# Patient Record
Sex: Female | Born: 1975 | Race: White | Hispanic: No | Smoking: Former smoker
Health system: Southern US, Community
[De-identification: ages and names within clinical notes are randomized; demographics above are authoritative.]

## PROBLEM LIST (undated history)

## (undated) DIAGNOSIS — F419 Anxiety disorder, unspecified: Secondary | ICD-10-CM

## (undated) DIAGNOSIS — F329 Major depressive disorder, single episode, unspecified: Secondary | ICD-10-CM

## (undated) DIAGNOSIS — I10 Essential (primary) hypertension: Secondary | ICD-10-CM

## (undated) DIAGNOSIS — F32A Depression, unspecified: Secondary | ICD-10-CM

## (undated) DIAGNOSIS — G35 Multiple sclerosis: Secondary | ICD-10-CM

## (undated) HISTORY — DX: Depression, unspecified: F32.A

## (undated) HISTORY — DX: Major depressive disorder, single episode, unspecified: F32.9

## (undated) HISTORY — PX: WISDOM TOOTH EXTRACTION: SHX21

## (undated) HISTORY — DX: Anxiety disorder, unspecified: F41.9

## (undated) HISTORY — DX: Multiple sclerosis: G35

## (undated) HISTORY — PX: MOUTH SURGERY: SHX715

---

## 2002-07-07 ENCOUNTER — Other Ambulatory Visit: Admission: RE | Admit: 2002-07-07 | Discharge: 2002-07-07 | Payer: Self-pay | Admitting: Obstetrics and Gynecology

## 2002-08-31 ENCOUNTER — Encounter: Admission: RE | Admit: 2002-08-31 | Discharge: 2002-08-31 | Payer: Self-pay | Admitting: Family Medicine

## 2002-08-31 ENCOUNTER — Encounter: Payer: Self-pay | Admitting: Family Medicine

## 2003-05-04 ENCOUNTER — Other Ambulatory Visit: Admission: RE | Admit: 2003-05-04 | Discharge: 2003-05-04 | Payer: Self-pay | Admitting: Obstetrics and Gynecology

## 2003-12-06 ENCOUNTER — Inpatient Hospital Stay (HOSPITAL_COMMUNITY): Admission: AD | Admit: 2003-12-06 | Discharge: 2003-12-08 | Payer: Self-pay | Admitting: Obstetrics and Gynecology

## 2005-09-08 ENCOUNTER — Inpatient Hospital Stay (HOSPITAL_COMMUNITY): Admission: AD | Admit: 2005-09-08 | Discharge: 2005-09-08 | Payer: Self-pay | Admitting: *Deleted

## 2005-12-17 ENCOUNTER — Inpatient Hospital Stay (HOSPITAL_COMMUNITY): Admission: AD | Admit: 2005-12-17 | Discharge: 2005-12-20 | Payer: Self-pay | Admitting: Obstetrics and Gynecology

## 2006-06-10 ENCOUNTER — Encounter: Admission: RE | Admit: 2006-06-10 | Discharge: 2006-06-10 | Payer: Self-pay | Admitting: Family Medicine

## 2006-07-10 ENCOUNTER — Encounter: Admission: RE | Admit: 2006-07-10 | Discharge: 2006-07-30 | Payer: Self-pay | Admitting: Neurology

## 2006-08-01 ENCOUNTER — Encounter: Admission: RE | Admit: 2006-08-01 | Discharge: 2006-08-01 | Payer: Self-pay | Admitting: Optometry

## 2006-09-07 ENCOUNTER — Encounter: Admission: RE | Admit: 2006-09-07 | Discharge: 2006-09-07 | Payer: Self-pay | Admitting: Neurology

## 2006-11-18 ENCOUNTER — Encounter: Admission: RE | Admit: 2006-11-18 | Discharge: 2006-11-18 | Payer: Self-pay | Admitting: Neurology

## 2007-01-28 ENCOUNTER — Encounter: Admission: RE | Admit: 2007-01-28 | Discharge: 2007-01-28 | Payer: Self-pay | Admitting: Obstetrics and Gynecology

## 2007-05-02 ENCOUNTER — Encounter: Admission: RE | Admit: 2007-05-02 | Discharge: 2007-05-02 | Payer: Self-pay | Admitting: *Deleted

## 2009-10-09 ENCOUNTER — Emergency Department (HOSPITAL_COMMUNITY): Admission: EM | Admit: 2009-10-09 | Discharge: 2009-10-09 | Payer: Self-pay | Admitting: Emergency Medicine

## 2010-03-04 ENCOUNTER — Encounter: Payer: Self-pay | Admitting: Neurology

## 2010-06-29 NOTE — H&P (Signed)
[name], [name]             ACCOUNT NO.:  [id number]   MEDICAL RECORD NO.:  [id number]          PATIENT TYPE:  INP   LOCATION:  9164                          FACILITY:  [facility]   PHYSICIAN:  Lenoard Aden, M.D.DATE OF BIRTH:  03/24/1975   DATE OF ADMISSION:  12/17/2005  DATE OF DISCHARGE:                                HISTORY & PHYSICAL   CHIEF COMPLAINT:  Post dates induction.   She is a 35 year old white female G2, P12, EDD December 13, 2005 at [redacted] weeks  gestation for Cervidil induction.  She has history of spontaneous delivery  x1 of 7 pound 2 ounce child in 2005.  No known drug allergies.   MEDICATIONS:  Prenatal vitamins.   MEDICAL HISTORY:  Remarkable for migraine headaches.   SOCIAL HISTORY:  Noncontributory.   FAMILY HISTORY:  She has a family history of heart disease, diabetes,  rheumatoid arthritis, lung and breast cancer.  She is a nonsmoker,  nondrinker, __________.   PHYSICAL EXAMINATION:  She is a well-developed, well-nourished white female  in no acute distress.  HEENT:  Normal.  LUNGS:  Clear.  HEART:  Regular rate and rhythm.  ABDOMEN:  Soft, gravid and nontender.  Estimated fetal weight is 7-1/2 to 8  pounds.  CERVIX:  2 cm, thick vertex, -2.  EXTREMITIES:  No cords.  NEUROLOGICAL:  Nonfocal.   IMPRESSION:  Post dates intrauterine pregnancy for Cervidil induction and  Cervidil placement.  Nonstress test reactive.  Anticipate attempts at  vaginal delivery.      Lenoard Aden, M.D.  Electronically Signed     RJT/MEDQ  D:  12/17/2005  T:  12/17/2005  Job:  [id number]

## 2011-02-20 DIAGNOSIS — Z124 Encounter for screening for malignant neoplasm of cervix: Secondary | ICD-10-CM | POA: Diagnosis not present

## 2011-02-20 DIAGNOSIS — Z30432 Encounter for removal of intrauterine contraceptive device: Secondary | ICD-10-CM | POA: Diagnosis not present

## 2011-02-20 DIAGNOSIS — Z01419 Encounter for gynecological examination (general) (routine) without abnormal findings: Secondary | ICD-10-CM | POA: Diagnosis not present

## 2011-03-06 DIAGNOSIS — G47419 Narcolepsy without cataplexy: Secondary | ICD-10-CM | POA: Diagnosis not present

## 2011-03-06 DIAGNOSIS — G35 Multiple sclerosis: Secondary | ICD-10-CM | POA: Diagnosis not present

## 2011-03-06 DIAGNOSIS — R5383 Other fatigue: Secondary | ICD-10-CM | POA: Diagnosis not present

## 2011-03-06 DIAGNOSIS — R5381 Other malaise: Secondary | ICD-10-CM | POA: Diagnosis not present

## 2011-03-26 DIAGNOSIS — S139XXA Sprain of joints and ligaments of unspecified parts of neck, initial encounter: Secondary | ICD-10-CM | POA: Diagnosis not present

## 2011-03-26 DIAGNOSIS — S239XXA Sprain of unspecified parts of thorax, initial encounter: Secondary | ICD-10-CM | POA: Diagnosis not present

## 2011-03-26 DIAGNOSIS — M999 Biomechanical lesion, unspecified: Secondary | ICD-10-CM | POA: Diagnosis not present

## 2011-03-26 DIAGNOSIS — S335XXA Sprain of ligaments of lumbar spine, initial encounter: Secondary | ICD-10-CM | POA: Diagnosis not present

## 2011-03-26 DIAGNOSIS — M9981 Other biomechanical lesions of cervical region: Secondary | ICD-10-CM | POA: Diagnosis not present

## 2011-04-02 DIAGNOSIS — R5383 Other fatigue: Secondary | ICD-10-CM | POA: Diagnosis not present

## 2011-04-02 DIAGNOSIS — R5381 Other malaise: Secondary | ICD-10-CM | POA: Diagnosis not present

## 2011-04-02 DIAGNOSIS — G35 Multiple sclerosis: Secondary | ICD-10-CM | POA: Diagnosis not present

## 2011-04-02 DIAGNOSIS — G47419 Narcolepsy without cataplexy: Secondary | ICD-10-CM | POA: Diagnosis not present

## 2011-05-04 DIAGNOSIS — G35 Multiple sclerosis: Secondary | ICD-10-CM | POA: Diagnosis not present

## 2011-05-09 DIAGNOSIS — R269 Unspecified abnormalities of gait and mobility: Secondary | ICD-10-CM | POA: Diagnosis not present

## 2011-05-09 DIAGNOSIS — R5381 Other malaise: Secondary | ICD-10-CM | POA: Diagnosis not present

## 2011-05-09 DIAGNOSIS — G35 Multiple sclerosis: Secondary | ICD-10-CM | POA: Diagnosis not present

## 2011-05-09 DIAGNOSIS — R5383 Other fatigue: Secondary | ICD-10-CM | POA: Diagnosis not present

## 2011-06-06 DIAGNOSIS — G35 Multiple sclerosis: Secondary | ICD-10-CM | POA: Diagnosis not present

## 2011-06-06 DIAGNOSIS — Z79899 Other long term (current) drug therapy: Secondary | ICD-10-CM | POA: Diagnosis not present

## 2011-06-18 DIAGNOSIS — G35 Multiple sclerosis: Secondary | ICD-10-CM | POA: Diagnosis not present

## 2011-06-18 DIAGNOSIS — Z79899 Other long term (current) drug therapy: Secondary | ICD-10-CM | POA: Diagnosis not present

## 2011-07-11 DIAGNOSIS — H4610 Retrobulbar neuritis, unspecified eye: Secondary | ICD-10-CM | POA: Diagnosis not present

## 2011-07-11 DIAGNOSIS — H5210 Myopia, unspecified eye: Secondary | ICD-10-CM | POA: Diagnosis not present

## 2011-07-11 DIAGNOSIS — G35 Multiple sclerosis: Secondary | ICD-10-CM | POA: Diagnosis not present

## 2011-07-11 DIAGNOSIS — H16229 Keratoconjunctivitis sicca, not specified as Sjogren's, unspecified eye: Secondary | ICD-10-CM | POA: Diagnosis not present

## 2011-07-24 DIAGNOSIS — G35 Multiple sclerosis: Secondary | ICD-10-CM | POA: Diagnosis not present

## 2011-07-24 DIAGNOSIS — R5383 Other fatigue: Secondary | ICD-10-CM | POA: Diagnosis not present

## 2011-07-24 DIAGNOSIS — I498 Other specified cardiac arrhythmias: Secondary | ICD-10-CM | POA: Diagnosis not present

## 2011-07-24 DIAGNOSIS — R5381 Other malaise: Secondary | ICD-10-CM | POA: Diagnosis not present

## 2011-08-09 DIAGNOSIS — S336XXA Sprain of sacroiliac joint, initial encounter: Secondary | ICD-10-CM | POA: Diagnosis not present

## 2011-08-09 DIAGNOSIS — M9981 Other biomechanical lesions of cervical region: Secondary | ICD-10-CM | POA: Diagnosis not present

## 2011-08-09 DIAGNOSIS — M530 Cervicocranial syndrome: Secondary | ICD-10-CM | POA: Diagnosis not present

## 2011-08-09 DIAGNOSIS — M999 Biomechanical lesion, unspecified: Secondary | ICD-10-CM | POA: Diagnosis not present

## 2011-08-09 DIAGNOSIS — IMO0002 Reserved for concepts with insufficient information to code with codable children: Secondary | ICD-10-CM | POA: Diagnosis not present

## 2011-08-12 DIAGNOSIS — S336XXA Sprain of sacroiliac joint, initial encounter: Secondary | ICD-10-CM | POA: Diagnosis not present

## 2011-08-12 DIAGNOSIS — IMO0002 Reserved for concepts with insufficient information to code with codable children: Secondary | ICD-10-CM | POA: Diagnosis not present

## 2011-08-12 DIAGNOSIS — M530 Cervicocranial syndrome: Secondary | ICD-10-CM | POA: Diagnosis not present

## 2011-08-12 DIAGNOSIS — M9981 Other biomechanical lesions of cervical region: Secondary | ICD-10-CM | POA: Diagnosis not present

## 2011-08-12 DIAGNOSIS — M999 Biomechanical lesion, unspecified: Secondary | ICD-10-CM | POA: Diagnosis not present

## 2011-08-19 DIAGNOSIS — S336XXA Sprain of sacroiliac joint, initial encounter: Secondary | ICD-10-CM | POA: Diagnosis not present

## 2011-08-19 DIAGNOSIS — M999 Biomechanical lesion, unspecified: Secondary | ICD-10-CM | POA: Diagnosis not present

## 2011-08-19 DIAGNOSIS — M530 Cervicocranial syndrome: Secondary | ICD-10-CM | POA: Diagnosis not present

## 2011-08-19 DIAGNOSIS — M9981 Other biomechanical lesions of cervical region: Secondary | ICD-10-CM | POA: Diagnosis not present

## 2011-08-19 DIAGNOSIS — IMO0002 Reserved for concepts with insufficient information to code with codable children: Secondary | ICD-10-CM | POA: Diagnosis not present

## 2011-08-22 DIAGNOSIS — M9981 Other biomechanical lesions of cervical region: Secondary | ICD-10-CM | POA: Diagnosis not present

## 2011-08-22 DIAGNOSIS — M999 Biomechanical lesion, unspecified: Secondary | ICD-10-CM | POA: Diagnosis not present

## 2011-08-22 DIAGNOSIS — IMO0002 Reserved for concepts with insufficient information to code with codable children: Secondary | ICD-10-CM | POA: Diagnosis not present

## 2011-08-22 DIAGNOSIS — S336XXA Sprain of sacroiliac joint, initial encounter: Secondary | ICD-10-CM | POA: Diagnosis not present

## 2011-08-22 DIAGNOSIS — M530 Cervicocranial syndrome: Secondary | ICD-10-CM | POA: Diagnosis not present

## 2011-08-28 DIAGNOSIS — IMO0002 Reserved for concepts with insufficient information to code with codable children: Secondary | ICD-10-CM | POA: Diagnosis not present

## 2011-08-28 DIAGNOSIS — M530 Cervicocranial syndrome: Secondary | ICD-10-CM | POA: Diagnosis not present

## 2011-08-28 DIAGNOSIS — M999 Biomechanical lesion, unspecified: Secondary | ICD-10-CM | POA: Diagnosis not present

## 2011-08-28 DIAGNOSIS — M9981 Other biomechanical lesions of cervical region: Secondary | ICD-10-CM | POA: Diagnosis not present

## 2011-08-28 DIAGNOSIS — S336XXA Sprain of sacroiliac joint, initial encounter: Secondary | ICD-10-CM | POA: Diagnosis not present

## 2011-09-04 DIAGNOSIS — M530 Cervicocranial syndrome: Secondary | ICD-10-CM | POA: Diagnosis not present

## 2011-09-04 DIAGNOSIS — IMO0002 Reserved for concepts with insufficient information to code with codable children: Secondary | ICD-10-CM | POA: Diagnosis not present

## 2011-09-04 DIAGNOSIS — S336XXA Sprain of sacroiliac joint, initial encounter: Secondary | ICD-10-CM | POA: Diagnosis not present

## 2011-09-04 DIAGNOSIS — M9981 Other biomechanical lesions of cervical region: Secondary | ICD-10-CM | POA: Diagnosis not present

## 2011-09-04 DIAGNOSIS — M999 Biomechanical lesion, unspecified: Secondary | ICD-10-CM | POA: Diagnosis not present

## 2011-09-05 DIAGNOSIS — R0602 Shortness of breath: Secondary | ICD-10-CM | POA: Diagnosis not present

## 2011-09-25 ENCOUNTER — Ambulatory Visit (INDEPENDENT_AMBULATORY_CARE_PROVIDER_SITE_OTHER): Payer: No Typology Code available for payment source | Admitting: Psychiatry

## 2011-09-25 ENCOUNTER — Encounter (HOSPITAL_COMMUNITY): Payer: Self-pay | Admitting: Psychiatry

## 2011-09-25 DIAGNOSIS — F430 Acute stress reaction: Secondary | ICD-10-CM

## 2011-09-25 DIAGNOSIS — F439 Reaction to severe stress, unspecified: Secondary | ICD-10-CM | POA: Insufficient documentation

## 2011-09-25 DIAGNOSIS — F329 Major depressive disorder, single episode, unspecified: Secondary | ICD-10-CM

## 2011-09-25 NOTE — Progress Notes (Signed)
Patient ID: [name], female   DOB: 1975-09-07, 36 y.o.   MRN: [id number] Patient:   [name]   DOB:   01/15/1976  MR Number:  [id number]  Location:  [facility] [address] [place] [address] Dept: [phone]           Date of Service:   09/25/11  Start Time:   2:00 pm End Time:   3:00 pm  Provider/Observer:  Carman Ching LCSW       Billing Code/Service: [phone]  Chief Complaint:     Chief Complaint  Patient presents with  . Depression    Reason for Service:  Symptoms of depression, feeling disconnected, "not like self", low self-esteem and low energy.  Current Status:  Patient reports not feeling like herself and rated a 7/9 on the depression symptom list indicating moderate depression symptoms and stress at home.   Reliability of Information: Patient appears to be a reliable informant.  Behavioral Observation: [name]  presents as a 36 y.o.-year-old Right Caucasian Female who appeared her stated age. her dress was Appropriate and she was Neat and Well Groomed and her manners were Appropriate to the situation.  There were not any physical disabilities noted.  she displayed an appropriate level of cooperation and motivation.  She reports this being her first session with a counselor and states she was referred by a friend of hers who recommended Clinical research associate. Patient was tearful as she described feeling increased "guilt" over not being a better mom to her two children, constant feelings of "irritablity", increased weight gain, insomnia, racing thoughts, low self-esteem and low energy. She states she has turned to alcohol more than usual to cope and is smoking cigarettes to manage the stress and worries about how these unhealthy behaviors could affect her diagnosis of Multiple Sclerosis. She appears motivated to reduce symptoms of depression and learn positive coping strategies.  She states she used to do Yoga and Meditation and found them helpful, but somehow got away from doing healthy activities for herself.   Interactions:    Active   Attention:   within normal limits  Memory:   within normal limits  Visuo-spatial:   within normal limits  Speech (Volume):  normal  Speech:   normal pitch and normal volume  Thought Process:  Coherent  Though Content:  WNL  Orientation:   person, place, time/date, situation, day of week, month of year and year  Judgment:   Good  Planning:   Good  Affect:    Depressed  Mood:    Depressed  Insight:   Good  Intelligence:   normal  Marital Status/Living: Patient lives with her husband and her two children (both girls ages 35 and 82) the 35 year old has been diagnosed on the Autism spectrum and is developmentally and socially delayed. Patient reports feeling "disconnected" from her husband and states he travels Monday through Friday and is home only on weekends.    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  Patient states she has concerns about the increase of wine she is consuming each night to cope with stress and states she drinks about two glasses per night. She also smokes cigarettes and wants to stop and states she hides this from her family.   Education:   Control and instrumentation engineer History:  Past Medical History  Diagnosis Date  . MS (multiple sclerosis)     Diagnosed in 2005  . Depression         No outpatient encounter prescriptions on file as of 09/25/2011.          Sexual History:   History  Sexual Activity  . Sexually Active: Not on file    Abuse/Trauma History: Denies  Psychiatric History:  Denies any previous mental health treatment but states she was prescribed Prozac, Wellbutrin and Xanax after learning she was diagnosed with Multiple Sclerosis.   Family Med/Psych History: No  family history on file.  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   Disposition/Plan:  Weekly counseling to address symptoms of depression, and reduce unhealthy coping skills (drinking and smoking) and increase positive coping skills (yoga, meditation) as well as explore "disconnection" in marriage and feelings of low-self-esteem.   Diagnosis:    Axis I:   1. Depression, major, single episode   2. Stress         Axis II: None       Axis III:  Multiple Sclerosis (MS)      Axis IV:  problems related to social environment and problems with primary support group          Axis V:  51-60 moderate symptoms

## 2011-09-27 DIAGNOSIS — Z79899 Other long term (current) drug therapy: Secondary | ICD-10-CM | POA: Diagnosis not present

## 2011-09-27 DIAGNOSIS — G35 Multiple sclerosis: Secondary | ICD-10-CM | POA: Diagnosis not present

## 2011-10-01 DIAGNOSIS — M9981 Other biomechanical lesions of cervical region: Secondary | ICD-10-CM | POA: Diagnosis not present

## 2011-10-01 DIAGNOSIS — M999 Biomechanical lesion, unspecified: Secondary | ICD-10-CM | POA: Diagnosis not present

## 2011-10-01 DIAGNOSIS — IMO0002 Reserved for concepts with insufficient information to code with codable children: Secondary | ICD-10-CM | POA: Diagnosis not present

## 2011-10-01 DIAGNOSIS — S336XXA Sprain of sacroiliac joint, initial encounter: Secondary | ICD-10-CM | POA: Diagnosis not present

## 2011-10-01 DIAGNOSIS — M530 Cervicocranial syndrome: Secondary | ICD-10-CM | POA: Diagnosis not present

## 2011-10-03 ENCOUNTER — Ambulatory Visit (INDEPENDENT_AMBULATORY_CARE_PROVIDER_SITE_OTHER): Payer: PRIVATE HEALTH INSURANCE | Admitting: Psychiatry

## 2011-10-03 ENCOUNTER — Encounter (HOSPITAL_COMMUNITY): Payer: Self-pay | Admitting: Psychiatry

## 2011-10-03 DIAGNOSIS — F329 Major depressive disorder, single episode, unspecified: Secondary | ICD-10-CM

## 2011-10-03 NOTE — Progress Notes (Signed)
   THERAPIST PROGRESS NOTE  Session Time: 1:00-1:50 pm  Participation Level: Active  Behavioral Response: Casual, Neat and Well GroomedAlertDepressed  Type of Therapy: Individual Therapy  Treatment Goals addressed: Coping  Interventions: Solution Focused, Strength-based and Supportive  Summary: [name] is a 36 y.o. female who presents with depressed mood and tearful affect. This is patients second therapy session. She reports depression at the same level as previous session with lack of motivation to spend time with her children and low energy and a desire to "escape" meaning leave and go stay by herself. She denies thoughts of suicide. She became more tearful as she described feeling guilty and needing to use this time as a time of confession. She shared that she has feelings of guilt associated with having contact with a man over texting a while back. She states she does not want to leave her husband and it bothers her that she engaged in that behavior and does not know why she enjoyed it. She described her marriage as a "good one" but it lacks "fun" and "excitment". She does not have plans to leave her husband. She states she is not on any antidepressants and was open to exploring medications to assist with treating her depression by getting a referral to a doctor within [facility]. She agreed to give thought to what she wants her goals to be for future treatment sessions to address her symptoms of depression.   Suicidal/Homicidal: Nowithout intent/plan  Therapist Response: Assessed symptoms of depressions and contributing social stressors. Provided supportive counseling and education on the possibility of medication management to assist with the treatment of her depression.   Plan: Return again in 1 weeks. Create treatment plan goals at next session. Patient is scheduled to see Dr. Lolly Mustache on Monday.   Diagnosis: Axis I: Major Depression, single episode, severe    Axis  II: None    Arihant Pennings E, LCSW 10/03/2011

## 2011-10-07 ENCOUNTER — Ambulatory Visit (INDEPENDENT_AMBULATORY_CARE_PROVIDER_SITE_OTHER): Payer: PRIVATE HEALTH INSURANCE | Admitting: Psychiatry

## 2011-10-07 ENCOUNTER — Encounter (HOSPITAL_COMMUNITY): Payer: Self-pay | Admitting: Psychiatry

## 2011-10-07 DIAGNOSIS — F1010 Alcohol abuse, uncomplicated: Secondary | ICD-10-CM

## 2011-10-07 DIAGNOSIS — R197 Diarrhea, unspecified: Secondary | ICD-10-CM | POA: Diagnosis not present

## 2011-10-07 DIAGNOSIS — F329 Major depressive disorder, single episode, unspecified: Secondary | ICD-10-CM

## 2011-10-07 MED ORDER — BUPROPION HCL ER (SR) 150 MG PO TB12: 150.0000 mg | ORAL_TABLET | Freq: Two times a day (BID) | ORAL | Status: DC

## 2011-10-07 NOTE — Progress Notes (Signed)
Chief complaint I need some help.  I have taken antidepressant in the past.  History presenting illness Patient is 36 year old Caucasian married female who came for the appointment to get some help.  Patient was referred from therapist in this office.  Patient experiencing increased anxiety and depressive symptoms.  Patient has multiple sclerosis and she lives with her husband and 2 daughter.  Her stressors are suffering from multiple sclerosis and one daughter who has autism spectrum disorder and needs special needs.  She's also feeling overwhelmed due to her parents are getting divorced.  They have been married for 35 years and know their marriage is falling apart.  Patient admitted she has difficult to concentrate and able to do things on a routine basis.  She admitted getting easily irritable angry and having crying spells.  She also endorse some time feeling of hopelessness and helplessness but denies any active or passive suicidal thoughts or homicidal thoughts.  She admitted having panic attack but denies any agitation anger or severe mood swing.  She feel fatigued with decreased energy all day.  She has limited desire to do things and she is worried about her future and her physical illness.  She is in process of getting her medication adjusted for her multiple sclerosis.  She admitted poor attention poor concentration and anhedonia.  Her husband travels Monday to Friday and is only available on the weekend.  She feel sometime disconnected to herself.  Her family lives in [place] and she has limited social network.  She denies any paranoia, psychosis or any manic-like symptoms.  She used to take Wellbutrin and Prozac 5 years ago which she stopped when she was feeling better.  2 months ago she start taking Prozac however after taking for a few weeks she felt worsening of the symptom and decided to start Prozac.  She admitted drinking more than usual in past few months.  She also admitted having  intoxication but denies any withdrawal symptoms, tremors or blackouts.  Patient admitted recently her symptoms are getting worse.  She denies any illegal substance use.  She is getting stimulants from neurologist Dr. Rebecka Apley at [facility] for increase attention concentration and energy.  She was never tested for ADD symptoms.    Current psychiatric medication Xanax 1 mg when necessary for severe anxiety prescribed by primary care physician Vyvanse 50 mg which she is taking as needed prescribed by neurologist Ambien 10 mg given by primary care physician however patient is not taking recently.    Past psychiatric history Patient denies any history of psychiatric inpatient treatment or any suicidal attempt.  She denies any history of psychosis mania or aggression.  She had history of depression and treated with initially Prozac and later Wellbutrin was added by a neurologist 5 years ago.  She did well on combination but stopped when she started to feel better.  She's getting stimulants from her primary care physician and neurologist for past 5 years.  She had tried Ritalin and Adderall and now she is taking Vyvanse.  Alcohol and substance use history Patient admitted history of drinking alcohol on and off but in recent months she has been drinking more than usual.  She admitted drinking every third and fourth night.  She also admitted history of intoxication but denies any blackouts seizures withdrawal or tremors.  She denies any history of illegal substance.  She admitted smoking cigarette but want to stop it.  Psychosocial history Patient was born and raised in [place].  She moved  to [place] 13 years ago.  She has been married for 10 years.  She has 2 daughter.  Her older daughter has autism disorder.  Her husband travels Monday to Friday.  She has limited social network.  Family history Patient admitted mother has history of depression.  Work history Patient states full-time at  home.  Medical history Patient has multiple sclerosis.  She see Dr. Katherina Mires who is managing her multiple sclerosis.  Mental status emanation Patient is casually dressed and fairly groomed.  She appears anxious but relevant in conversation.  Her speech is slow but clear and coherent.  Her thought process is logical linear and goal-directed.  She described her mood is depressed and anxious.  Her affect is constricted.  At time she was tearful when she was talking about her parents and elderly daughter who required special needs.  Her fund of knowledge is adequate.  Her attention and concentration is fair.  She denies any active or passive suicidal thoughts or homicidal thought.  She denies any auditory or visual hallucination.  There were no tremors or shakes present.  There were no psychotic symptoms present at this time.  She's alert and oriented x3.  Her insight judgment and impulse control is okay.  Assessment Axis I depressive disorder NOS, rule out Major depressive disorder, alcohol abuse Axis II deferred Axis III multiple sclerosis Axis IV moderate Axis V 60-70  Plan I reviewed psychosocial stressors, history, medication response to the medication.  At this time patient start taking Prozac due to limited response.  In the past she has taken Wellbutrin with good response however she do not remember the dosage.  She's also taking stimulant, Xanax as needed and Ambien as needed.  She also drinking alcohol which has been increased in recent months.  I talked in detail about interaction of stimulants, benzodiazepine and hypnotics with alcohol, psychiatric illness and psychiatric medication.  Patient acknowledged and decided to stop drinking alcohol and Ambien.  I recommend to try Wellbutrin SR 150 mg twice a day to target depression.  I also recommend that she can take Vyvanse if she still feels decreased energy and concentration.  However I explain that Wellbutrin can be helpful for her attention  and concentration.  She will see therapist in this office for coping and social skills.  I recommend to call us if she has any question or concern if she feel worsening of the symptoms.  I also discuss safety plan that anytime if she having suicidal thoughts or homicidal thoughts and she need to call 911 or go to local emergency room.  Time spent 60 minutes.  We will get collateral information from her primary care physician including recent labs.  I will see her again in 2-3 weeks.  Portion of this note is generated with voice dictation software and may contain typographical error.

## 2011-10-08 DIAGNOSIS — M9981 Other biomechanical lesions of cervical region: Secondary | ICD-10-CM | POA: Diagnosis not present

## 2011-10-08 DIAGNOSIS — M999 Biomechanical lesion, unspecified: Secondary | ICD-10-CM | POA: Diagnosis not present

## 2011-10-08 DIAGNOSIS — M530 Cervicocranial syndrome: Secondary | ICD-10-CM | POA: Diagnosis not present

## 2011-10-08 DIAGNOSIS — IMO0002 Reserved for concepts with insufficient information to code with codable children: Secondary | ICD-10-CM | POA: Diagnosis not present

## 2011-10-08 DIAGNOSIS — S336XXA Sprain of sacroiliac joint, initial encounter: Secondary | ICD-10-CM | POA: Diagnosis not present

## 2011-10-10 DIAGNOSIS — K5289 Other specified noninfective gastroenteritis and colitis: Secondary | ICD-10-CM | POA: Diagnosis not present

## 2011-10-10 DIAGNOSIS — K921 Melena: Secondary | ICD-10-CM | POA: Diagnosis not present

## 2011-10-21 ENCOUNTER — Ambulatory Visit (INDEPENDENT_AMBULATORY_CARE_PROVIDER_SITE_OTHER): Payer: Medicare Other | Admitting: Psychiatry

## 2011-10-21 DIAGNOSIS — F329 Major depressive disorder, single episode, unspecified: Secondary | ICD-10-CM

## 2011-10-21 NOTE — Progress Notes (Signed)
   THERAPIST PROGRESS NOTE  Session Time: 1:00-1:50 pm  Participation Level: Active  Behavioral Response: Casual and NeatAlertDepressed  Type of Therapy: Individual Therapy  Treatment Goals addressed: Coping  Interventions: Other: Created Treatment Plan Goals  Summary: [name] is a 36 y.o. female who presents with less depressed mood and affect but was unsure of what caused the change. Used entire session to create treatment plan goals - see below. # 1) Depression o To feel inner happiness, to feel "complete" rather than have a "broken feeling" too feel "proud of myself". o Elevate mood and return to a previous level of effective functioning.  o Reduce negative coping skills (drinking and smoking) and increase positive ones (exercise, yoga and meditation) o Increase energy level and be able to "connect"  more with children. o Be able to focus more and be "in the moment" by spending "meaningful time" with my children and enjoying it.  o Weekly Individual therapy o Explore how depression is experienced in day-to-day living and identify negative copings strategies and introduce positive ones o Introduce healthy ways to reduce depression symptoms o Identify causes of low energy and identify strategies to improve daily energy.  o Explore how daily routine impacts depression and explore a healthier one.  04/19/12   REVIEW DATE JUSTIFICATION FOR CONDINTUATION/DISCONTINUATION OF GOAL:     PROBLEM   GOALS INTERVENTIONS/SERVICES TARGET DATE DATE MET  #2) Low Self-Esteem o I would like to feel good about myself. To have more positive views of self. Recognize more positive qualities in self.  o Not let my mom have an impact on my self-esteem. o Identify how negative thinking impacts low-self-esteem and learn ways to challenge it.  o Weekly Individual therapy o Explore critical judgments of self and teach healthy ways to challenge these statements.  o Explore how mom impacts current  level of self-esteem. o Explore feelings and behaviors associated with low self-esteem. o Cognitive Behavioral techniques to help pt. adjust negative thinking patterns. o Teach coping strategies to improve self-esteem (self -care, positive affirmations and statements as a way to challenge negative perceptions of self) 04/19/12    #3) MOM o Learn how to have a healthier relationship with my mother, set healthy limits, be more assertive and have an acceptance of her personality.  o Teach boundary setting and assertiveness skills and facilitate ways to feel less emotionally charged in her relationship with her mother.   04/19/12     Suicidal/Homicidal: Nowithout intent/plan  Therapist Response: Created Treatment plan goals and agreed to resume weekly counseling to address these goals.  Plan: Return again in 1 week.  Diagnosis: Axis I: Major Depression, Single Episode    Axis II: None    Yazlin Ekblad E, LCSW 10/21/2011

## 2011-10-28 ENCOUNTER — Ambulatory Visit (HOSPITAL_COMMUNITY): Payer: Medicare Other | Admitting: Psychiatry

## 2011-11-01 ENCOUNTER — Encounter (HOSPITAL_COMMUNITY): Payer: Self-pay | Admitting: Psychiatry

## 2011-11-01 ENCOUNTER — Ambulatory Visit (INDEPENDENT_AMBULATORY_CARE_PROVIDER_SITE_OTHER): Payer: Medicare Other | Admitting: Psychiatry

## 2011-11-01 VITALS — Wt 192.8 lb

## 2011-11-01 DIAGNOSIS — F329 Major depressive disorder, single episode, unspecified: Secondary | ICD-10-CM

## 2011-11-01 DIAGNOSIS — F1010 Alcohol abuse, uncomplicated: Secondary | ICD-10-CM

## 2011-11-01 MED ORDER — BUPROPION HCL ER (XL) 300 MG PO TB24: 300.0000 mg | ORAL_TABLET | ORAL | Status: DC

## 2011-11-01 NOTE — Progress Notes (Signed)
Chief complaint I feel better with Wellbutrin.    History presenting illness Patient is 36 year old Caucasian married female who came for her followup appointment.  On her last visit we tried Wellbutrin.  She is taking Wellbutrin SR twice a day.  She is feeling better and her anxiety and depression.  She's also seeing therapist in this office.  She denies any recent crying spells however she continued to have decreased energy and decreased motivation.  Her anger and mood irritability is much improved from the past.  She sleeping 6-7 hours.  She is tolerating Wellbutrin and denies any side effects.  She had cut down her drinking .  She stopped taking Prozac.  She still takes Vyvanse on occasion but not taking Xanax and Ambien in past few weeks.  She is wondering if she can still take Vyvanse when she feels ready tired but decreased energy.  Current psychiatric medication Vyvanse 50 mg which she is taking as needed prescribed by neurologist as needed Wellbutrin SR 150 twice a day   Past psychiatric history Patient denies any history of psychiatric inpatient treatment or any suicidal attempt.  She denies any history of psychosis mania or aggression.  She had history of depression and treated with initially Prozac and later Wellbutrin was added by a neurologist 5 years ago.  She did well on combination but stopped when she started to feel better.  She's getting stimulants from her primary care physician and neurologist for past 5 years.  She had tried Ritalin and Adderall and now she is taking Vyvanse.  Alcohol and substance use history Patient admitted history of drinking alcohol on and off but in recent months she has been drinking more than usual.  She admitted drinking every third and fourth night.  She also admitted history of intoxication but denies any blackouts seizures withdrawal or tremors.  She denies any history of illegal substance.  She admitted smoking cigarette but want to stop it.  She has  cut down her current drinking.  Psychosocial history Patient was born and raised in [place].  She moved to [place] 13 years ago.  She has been married for 10 years.  She has 2 daughter.  Her older daughter has autism disorder.  Her husband travels Monday to Friday.  She has limited social network.  Family history Patient admitted mother has history of depression.  Work history Patient states full-time at home.  Medical history Patient has multiple sclerosis.  She see Dr. Ledon Snare who is managing her multiple sclerosis.  Mental status emanation Patient is casually dressed and fairly groomed.  She is cooperative and maintained fair eye contact.  She described her mood is better and her affect is improved from the past.  Her speech is soft clear and coherent.  Her thought process is logical linear and goal-directed. Her attention and concentration is fair.  She denies any active or passive suicidal thoughts or homicidal thought.  She denies any auditory or visual hallucination.  There were no tremors or shakes present.  There were no psychotic symptoms present at this time.  She's alert and oriented x3.  Her insight judgment and impulse control is okay.  Assessment Axis I depressive disorder NOS, rule out Major depressive disorder, alcohol abuse Axis II deferred Axis III multiple sclerosis Axis IV moderate Axis V 60-70  Plan I will continue Wellbutrin however we will provide Wellbutrin XL 300 mg daily to avoid multiple doses.  I strongly recommend to stop drinking due to the interaction  of psychotropic medication.  She will see therapist for coping and social skills.  I explain that Wellbutrin should help her attention and focus and energy level however if she still require on occasion taking Vyvanse she can take it.  We talk about potential interaction of psychotropic medications with alcohol.  At this time patient does not have any side effects of medication.  I will see her again  in 3-4 weeks.  She's not taking Ambien and Xanax .  Time spent 30 minutes.   Portion of this note is generated with voice dictation software and may contain typographical error.

## 2011-11-08 ENCOUNTER — Ambulatory Visit (HOSPITAL_COMMUNITY): Payer: Medicare Other | Admitting: Psychiatry

## 2011-11-15 ENCOUNTER — Ambulatory Visit (HOSPITAL_COMMUNITY): Payer: Medicare Other | Admitting: Psychiatry

## 2011-11-22 ENCOUNTER — Ambulatory Visit (INDEPENDENT_AMBULATORY_CARE_PROVIDER_SITE_OTHER): Payer: Medicare Other | Admitting: Psychiatry

## 2011-11-22 ENCOUNTER — Encounter (HOSPITAL_COMMUNITY): Payer: Self-pay | Admitting: Psychiatry

## 2011-11-22 VITALS — Wt 193.0 lb

## 2011-11-22 DIAGNOSIS — F1010 Alcohol abuse, uncomplicated: Secondary | ICD-10-CM

## 2011-11-22 DIAGNOSIS — F329 Major depressive disorder, single episode, unspecified: Secondary | ICD-10-CM

## 2011-11-22 MED ORDER — BUPROPION HCL ER (XL) 300 MG PO TB24: 300.0000 mg | ORAL_TABLET | ORAL | Status: DC

## 2011-11-22 NOTE — Progress Notes (Signed)
Chief complaint Medication management and followup.    History presenting illness Patient came for her followup appointment.  She likes Wellbutrin XL.  She described her mood is better and she has good energy level.  She missed appointment with the therapist due to the conflict.  She was scheduled to see neurologist for her multiple sclerosis management .  Her medication is changed since she could not tolerate previous multiple sclerosis medication.  She still has a middle symptoms of multiple sclerosis .  Sometime she feel numbness , poor attention and concentration and difficulty in walking .  She is worried about her multiple sclerosis however she is willing to try new medication.  Overall her anxiety depression and attention is much improved.  She continues to have some insomnia and very rarely takes Ambien.  Her drinking is cut down and she do not recall any binge drinking since her last visit.  She still takes Vyvanse when she feels ready down however she is wondering if she can take amphetamine salts rather than Vyvanse.  She endorse much improvement in her anger and mood with Wellbutrin.  She denies any side effects of medication.  She denies any tremors shakes or any headache.  She sleeping better.  She denies any recent crying spells.  She like to resume her therapy.  Her appetite is unchanged from the past.  She is excited about the medication with her friends on this weekend.    Current psychiatric medication Vyvanse 50 mg which she is taking as needed prescribed by neurologist as needed Wellbutrin XL 300 mg daily   Past psychiatric history Patient denies any history of psychiatric inpatient treatment or any suicidal attempt.  She denies any history of psychosis mania or aggression.  She had history of depression and treated with initially Prozac and later Wellbutrin was added by a neurologist 5 years ago.  She did well on combination but stopped when she started to feel better.  She's getting  stimulants from her primary care physician and neurologist for past 5 years.  She had tried Ritalin and Adderall and now she is taking Vyvanse.  Alcohol and substance use history Patient admitted history of drinking alcohol which has been more in past few months.  Since coming to this office patient has significantly cut down her drinking.  She denies any history of any illegal substance use.    Psychosocial history Patient was born and raised in [place].  She moved to [place] 13 years ago.  She has been married for 10 years.  She has 2 daughter.  Her older daughter has autism disorder.  Her husband travels Monday to Friday.  She has limited social network.  Family history Patient admitted mother has history of depression.  Work history Patient stays full-time at home.  Medical history Patient has multiple sclerosis.  She see Dr. Ledon Snare who is managing her multiple sclerosis.  Mental status emanation Patient is casually dressed and fairly groomed.  She is cooperative and maintained fair eye contact.  She described her mood is good and her affect is improved from the past. Her speech is soft clear and coherent.  Her thought process is logical linear and goal-directed. Her attention and concentration is fair.  She denies any active or passive suicidal thoughts or homicidal thought.  She denies any auditory or visual hallucination.  There were no tremors or shakes present.  There were no psychotic symptoms present at this time.  She's alert and oriented x3.  Her  insight judgment and impulse control is okay.  Assessment Axis I depressive disorder NOS, rule out Major depressive disorder, alcohol abuse Axis II deferred Axis III multiple sclerosis Axis IV moderate Axis V 60-70  Plan I reviewed her medication and update her history.  Patient showing some improvement with Wellbutrin XL 300 mg once a day.  Very rarely she takes Vyvanse .  I recommend she can try a small dose of  amphetamine salts in the afternoon if she feels very tired provided that it should not cause any insomnia.  I also recommend that if she tried amphetamine salts that she should not take Vyvanse or any other stimulants.  I will continue Wellbutrin XL 300 mg daily.  I encourage her to see therapist for coping and social skills.  I recommend to call us if she is a question or concern about the medication.  More than 50% of the time spent in counseling, psychoeducation and coordination of care.  I will see her again in 2 months.  Portion of this note is generated with voice dictation software and may contain typographical error.

## 2011-12-12 ENCOUNTER — Other Ambulatory Visit (HOSPITAL_COMMUNITY): Payer: Self-pay | Admitting: Psychiatry

## 2011-12-13 ENCOUNTER — Other Ambulatory Visit (HOSPITAL_COMMUNITY): Payer: Self-pay | Admitting: Psychiatry

## 2011-12-13 NOTE — Telephone Encounter (Signed)
Given on 11/22/11 with refills

## 2011-12-18 ENCOUNTER — Other Ambulatory Visit (HOSPITAL_COMMUNITY): Payer: Self-pay | Admitting: Psychiatry

## 2011-12-18 NOTE — Telephone Encounter (Signed)
New script given

## 2011-12-19 ENCOUNTER — Telehealth (HOSPITAL_COMMUNITY): Payer: Self-pay | Admitting: *Deleted

## 2011-12-19 DIAGNOSIS — F329 Major depressive disorder, single episode, unspecified: Secondary | ICD-10-CM

## 2011-12-19 MED ORDER — BUPROPION HCL ER (XL) 300 MG PO TB24: 300.0000 mg | ORAL_TABLET | ORAL | Status: DC

## 2011-12-19 NOTE — Telephone Encounter (Signed)
Contacted pharmacy to inquire about the RX sent thru [facility]ribe 11/22/11 for 30 day supply of Wellbutrin XL w/ 1 refill. [facility] has no record of this prescription. Prescription given to pharmacy by phone 12/19/11

## 2011-12-20 DIAGNOSIS — G35 Multiple sclerosis: Secondary | ICD-10-CM | POA: Diagnosis not present

## 2011-12-20 DIAGNOSIS — R269 Unspecified abnormalities of gait and mobility: Secondary | ICD-10-CM | POA: Diagnosis not present

## 2011-12-20 DIAGNOSIS — G959 Disease of spinal cord, unspecified: Secondary | ICD-10-CM | POA: Diagnosis not present

## 2011-12-20 DIAGNOSIS — G47419 Narcolepsy without cataplexy: Secondary | ICD-10-CM | POA: Diagnosis not present

## 2011-12-21 DIAGNOSIS — G35 Multiple sclerosis: Secondary | ICD-10-CM | POA: Diagnosis not present

## 2011-12-22 DIAGNOSIS — G35 Multiple sclerosis: Secondary | ICD-10-CM | POA: Diagnosis not present

## 2012-01-21 ENCOUNTER — Encounter (HOSPITAL_COMMUNITY): Payer: Self-pay | Admitting: Psychiatry

## 2012-01-21 ENCOUNTER — Ambulatory Visit (INDEPENDENT_AMBULATORY_CARE_PROVIDER_SITE_OTHER): Payer: PRIVATE HEALTH INSURANCE | Admitting: Psychiatry

## 2012-01-21 VITALS — BP 132/98 | HR 82 | Wt 191.4 lb

## 2012-01-21 DIAGNOSIS — F329 Major depressive disorder, single episode, unspecified: Secondary | ICD-10-CM

## 2012-01-21 DIAGNOSIS — G35 Multiple sclerosis: Secondary | ICD-10-CM | POA: Insufficient documentation

## 2012-01-21 MED ORDER — LISDEXAMFETAMINE DIMESYLATE 40 MG PO CAPS: 40.0000 mg | ORAL_CAPSULE | ORAL | Status: DC

## 2012-01-21 NOTE — Progress Notes (Signed)
Patient ID: [name], female   DOB: [birth year], 36 y.o.   MRN: [id number] Chief complaint I feeling very tired.  I have no energy.  I stop taking Vyvanse.    History presenting illness Patient came for her followup appointment.  She's compliant with Wellbutrin.  She feels less depressed and less anxious but she feels very tired and has decreased energy and concentration.  She is not taking Vyvanse regularly.  She took only 2 times since her last visit.  She's thinking to restart her multiple sclerosis medication which she had tried in the past.  She reported side effects with Tecfidera and have GI side effects but patient has no other choice.  She feel that she need to take some medication for her multiple sclerosis.  She had a quiet Thanksgiving.  She has cut down significant drinking and only drinks and that is a [employer].  Patient denies any binge drinking.  She sleeping better.  She denies any agitation anger however she has some crying spells.  She denies any tremors or shakes.  She scheduled to see therapist in January but she is hoping to get earlier appointment.  She denies any feeling of hopelessness or helplessness but endorse decreased energy and feeling tired.  She is planning to visit [place] to visit her family on Christmas.  Current psychiatric medication Vyvanse 50 mg but not taking regularly. Wellbutrin XL 300 mg daily   Past psychiatric history Patient denies any history of psychiatric inpatient treatment or any suicidal attempt.  She denies any history of psychosis mania or aggression.  She had history of depression and treated with initially Prozac and later Wellbutrin was added by a neurologist 5 years ago.  She did well on combination but stopped when she started to feel better.  She's getting stimulants from her primary care physician and neurologist for past 5 years.  She had tried Ritalin and Adderall and now she is taking Vyvanse.  Alcohol and substance use  history Patient admitted history of drinking alcohol which has been more in past few months.  Since coming to this office patient has significantly cut down her drinking.  She denies any history of any illegal substance use.    Psychosocial history Patient was born and raised in [place].  She moved to [place] 13 years ago.  She has been married for 10 years.  She has 2 daughter.  Her older daughter has autism disorder.  Her husband travels Monday to Friday.  She has limited social network.  Family history Patient admitted mother has history of depression.  Work history Patient stays full-time at home.  Medical history Patient has multiple sclerosis.  She see Dr. Ledon Snare who is managing her multiple sclerosis.  Review of Systems  Constitutional: Positive for malaise/fatigue.  HENT: Negative.   Respiratory: Negative.   Neurological: Negative for dizziness, seizures and loss of consciousness.  Psychiatric/Behavioral: Positive for depression. Negative for suicidal ideas, hallucinations and memory loss. The patient is nervous/anxious. The patient does not have insomnia.        Decrease attention and concentration.  Decreased energy   Filed Vitals:   01/21/12 0958  BP: 132/98  Pulse: 82   Mental status emanation Patient is casually dressed and fairly groomed.  She is cooperative and maintained fair eye contact.  She described her mood is tired and her affect is mood appropriate.  Her speech is soft clear and coherent.  Her thought process is logical linear and goal-directed. Her  attention and concentration is fair.  She denies any active or passive suicidal thoughts or homicidal thought.  She denies any auditory or visual hallucination.  There were no tremors or shakes present.  There were no psychotic symptoms present at this time.  Her fund of knowledge is adequate.  There were no flight of ideas or loose association.  She's alert and oriented x3.  Her insight judgment and impulse  control is okay.  Assessment Axis I depressive disorder NOS, rule out Major depressive disorder, alcohol abuse Axis II deferred Axis III  Patient Active Problem List  Diagnosis  . MS (multiple sclerosis)  Axis IV moderate Axis V 60-70  Plan I reviewed her medication and update her history.  I believe patient is feeling decrease energy and feeling more tired since she is not taking Vyvanse every day.  I recommend to restart Vyvanse 40 mg every day .  She was prescribed 50 mg but she was not taking every day do to concern about increased stimulation.  We will decrease the dose of Vyvanse  To 40 mg.  One more time we talk about alcohol use which she's been doing socially.  We talk about interaction about psychotropic medication and stimulants.  She scheduled to see therapist on generally 22nd however she is on cancellation list and may get earlier appointment.  I recommend to call us if she is any question or concern about the medication if she feels worsening of the symptom.  I will see her again in 4-6 weeks.  Time spent 30 minutes.  She has remaining refill on Wellbutrin.  Portion of this note is generated with voice dictation software and may contain typographical error.

## 2012-02-28 ENCOUNTER — Encounter (HOSPITAL_COMMUNITY): Payer: Self-pay | Admitting: Psychiatry

## 2012-02-28 ENCOUNTER — Ambulatory Visit (INDEPENDENT_AMBULATORY_CARE_PROVIDER_SITE_OTHER): Payer: PRIVATE HEALTH INSURANCE | Admitting: Psychiatry

## 2012-02-28 VITALS — Wt 188.2 lb

## 2012-02-28 DIAGNOSIS — F329 Major depressive disorder, single episode, unspecified: Secondary | ICD-10-CM

## 2012-02-28 DIAGNOSIS — F1010 Alcohol abuse, uncomplicated: Secondary | ICD-10-CM

## 2012-02-28 MED ORDER — LISDEXAMFETAMINE DIMESYLATE 40 MG PO CAPS: 40.0000 mg | ORAL_CAPSULE | ORAL | Status: DC

## 2012-02-28 MED ORDER — BUPROPION HCL ER (XL) 300 MG PO TB24: 300.0000 mg | ORAL_TABLET | ORAL | Status: DC

## 2012-02-28 NOTE — Progress Notes (Signed)
Patient ID: [name], female   DOB: [birth year], 37 y.o.   MRN: [id number] Chief complaint I am feeling better with Vyvanse 40 mg.  I like my current medication.  History presenting illness Patient came for her followup appointment.   she visited [place] on Christmas.  Since she back she feels tired but denies any depressive symptoms.  She sleeping on and off but overall her mood has been stable.  She likes Vyvanse 40 mg the is getting her energy .  She's compliant with her Wellbutrin and reported no side effects.  She denies any chest pain , nervousness , tremors or any anxiety symptoms.  She feels her attention and concentration is much improved.  She denies any crying spells.  She still has some physical symptoms related to multiple sclerosis but overall her mood has been improved.  She's not drinking or using any illegal substance.  She has no concern about her medication.  Current psychiatric medication Vyvanse 40 mg but not taking regularly. Wellbutrin XL 300 mg daily   Past psychiatric history Patient denies any history of psychiatric inpatient treatment or any suicidal attempt.  She denies any history of psychosis mania or aggression.  She had history of depression and treated with initially Prozac and later Wellbutrin was added by a neurologist 5 years ago.  She did well on combination but stopped when she started to feel better.  She's getting stimulants from her primary care physician and neurologist for past 5 years.  She had tried Ritalin and Adderall and now she is taking Vyvanse.  Alcohol and substance use history Patient admitted history of drinking alcohol which has been more in past few months.  Since coming to this office patient has significantly cut down her drinking.  She denies any history of any illegal substance use.    Psychosocial history Patient was born and raised in [place].  She moved to [place] 13 years ago.  She has been married for 10 years.   She has 2 daughter.  Her older daughter has autism disorder.  Her husband travels Monday to Friday.  She has limited social network.  Family history Patient admitted mother has history of depression.  Work history Patient stays full-time at home.  Medical history Patient has multiple sclerosis.  She see Dr. Ledon Snare who is managing her multiple sclerosis.  Review of Systems  Constitutional: Positive for malaise/fatigue.  HENT: Negative.   Respiratory: Negative.   Neurological: Negative for dizziness, seizures and loss of consciousness.  Psychiatric/Behavioral: Negative for suicidal ideas, hallucinations and memory loss. The patient is nervous/anxious. The patient does not have insomnia.        Decrease attention and concentration.  Decreased energy   There were no vitals filed for this visit. Mental status emanation Patient is casually dressed and fairly groomed.  She is cooperative and maintained fair eye contact.  She described her mood is  okay and her affect is mood appropriate.  Her speech is soft clear and coherent.  Her thought process is logical linear and goal-directed. Her attention and concentration isimproved from the past.  She denies any active or passive suicidal thoughts or homicidal thought.  She denies any auditory or visual hallucination.  There were no tremors or shakes present.  There were no psychotic symptoms present at this time.  Her fund of knowledge is adequate.  There were no flight of ideas or loose association.  She's alert and oriented x3.  Her insight judgment and impulse control  is okay.  Assessment Axis I depressive disorder NOS, rule out Major depressive disorder, alcohol abuse Axis II deferred Axis III  Patient Active Problem List  Diagnosis  . MS (multiple sclerosis)  Axis IV moderate Axis V 60-70  Plan I will continue her current psychiatric medication .  I explained risks and benefits of medication and recommend to call us if she has any question  or concern if she feels worsening of the symptom.  I will see her again in 2 months.    Portion of this note is generated with voice dictation software and may contain typographical error.

## 2012-03-04 ENCOUNTER — Telehealth (HOSPITAL_COMMUNITY): Payer: Self-pay

## 2012-03-04 ENCOUNTER — Ambulatory Visit (INDEPENDENT_AMBULATORY_CARE_PROVIDER_SITE_OTHER): Payer: PRIVATE HEALTH INSURANCE | Admitting: Psychiatry

## 2012-03-04 DIAGNOSIS — F322 Major depressive disorder, single episode, severe without psychotic features: Secondary | ICD-10-CM | POA: Insufficient documentation

## 2012-03-04 NOTE — Progress Notes (Signed)
   THERAPIST PROGRESS NOTE  Session Time: 3:00-3:50 pm  Participation Level: Active  Behavioral Response: Neat and Well GroomedAlertDepressed  Type of Therapy: Individual Therapy  Treatment Goals addressed: Coping  Interventions: Solution Focused and Supportive  Summary: [name] is a 37 y.o. female who presents with severe depressed mood and tearful affect. This is patients first session with writer since September and she states she stopped attending regular sessions because "life got in the way". She also struggled with writers availability matching her schedule and expressed a need for morning appointments. The session was used to provide an update on stressors, assess level of depression and refer patient to a different counselor Orvan July) who could see patient for morning appointments. Patient described having feelings of sadness, guilt, worthlessness and shame over not being able to hold things together in her life. She also described feeling dissatisfied in her marriage and feeling more and more disconnected with her husband. She described how she feels unsupported by him due to him not helping with the children or around the house and said they have "different points of view on everything". She said she wants to try to save the marriage and is open to the idea of couples counseling and agrees it is necessary if they are going to try to salvage their marriage, but is worried about how to bring the subject up with him. She said she feels like she needs her own counseling to work on herself and her own depression as well.   Suicidal/Homicidal: Nowithout intent/plan  Therapist Response: Assessed level of depression, provided supportive counseling, referred patient to a new therapist for ongoing individual counseling and recommended couples counseling.   Plan: Begin individual therapy with Orvan July.  Diagnosis: Axis I: Depression, Major, Single Episode  Severe    Axis II: No diagnosis    Jillian Ching, LCSW 03/04/2012

## 2012-03-16 ENCOUNTER — Ambulatory Visit (HOSPITAL_COMMUNITY): Payer: Self-pay | Admitting: Licensed Clinical Social Worker

## 2012-03-16 ENCOUNTER — Encounter (HOSPITAL_COMMUNITY): Payer: Self-pay | Admitting: Licensed Clinical Social Worker

## 2012-03-16 ENCOUNTER — Ambulatory Visit (INDEPENDENT_AMBULATORY_CARE_PROVIDER_SITE_OTHER): Payer: PRIVATE HEALTH INSURANCE | Admitting: Licensed Clinical Social Worker

## 2012-03-16 DIAGNOSIS — F322 Major depressive disorder, single episode, severe without psychotic features: Secondary | ICD-10-CM | POA: Diagnosis not present

## 2012-03-16 NOTE — Progress Notes (Signed)
   THERAPIST PROGRESS NOTE  Session Time: 11:30am-12:20pm  Participation Level: Active  Behavioral Response: Well GroomedAlertAnxious and Depressed  Type of Therapy: Individual Therapy  Treatment Goals addressed: Coping  Interventions: CBT, Motivational Interviewing, Strength-based, Supportive and Reframing  Summary: [name] is a 37 y.o. female who presents with depressed mood and tearful affect. Patient has been transferred from [facility], LCSW due to schedule conflicts. She reports feeling depressed and anxious, with chronic feelings of guilt. She is anxious with racing and negative thoughts about whether or not she is a good enough mother. She endorses stress in her marriage. She does not feel supported by her husband and struggles to take care of her two children during the week, while he is out of town. She is drinking more alcohol than she knows she should and does smoke. She drinks about 4 nights per week, and will have between one and four glasses of wine. She smokes 3/4 of a pack a day. She endorses increased isolation, poor motivation, and increased sleep during the day. She has not been taking medication for MS and feels that she has lost motivation to remain healthy. She denies any suicidal ideation, intent or plan. She reports this as the first depressive episode in her life. Her appetite is wnl.    Suicidal/Homicidal: Nowithout intent/plan  Therapist Response: Assessed patients current functioning and reviewed progress. Reviewed coping strategies. Assessed patients safety and assisted in identifying protective factors.  Reviewed crisis plan with patient. Assisted patient with the expression of her feelings of depression, frustration and anxiety. Used CBT to assist patient with the identification of negative distortions and irrational thoughts. Encouraged patient to verbalize alternative and factual responses which challenge thought distortions. Patient struggle with  strong self doubt and low self esteem. Used motivational interviewing to assist and encourage patient through the change process. Explored patients barriers to change. Reviewed patients self care plan. Assessed  progress related to self care. Patient's self care is fair. Recommend proper diet, regular exercise, socialization and recreation.   Plan: Return again in one weeks.  Diagnosis: Axis I: Major Depression, single episode    Axis II: No diagnosis    Jaylen Claude, LCSW 03/16/2012

## 2012-03-17 ENCOUNTER — Ambulatory Visit (HOSPITAL_COMMUNITY): Payer: Self-pay | Admitting: Psychiatry

## 2012-03-23 ENCOUNTER — Ambulatory Visit (INDEPENDENT_AMBULATORY_CARE_PROVIDER_SITE_OTHER): Payer: PRIVATE HEALTH INSURANCE | Admitting: Licensed Clinical Social Worker

## 2012-03-23 DIAGNOSIS — F322 Major depressive disorder, single episode, severe without psychotic features: Secondary | ICD-10-CM

## 2012-03-23 NOTE — Progress Notes (Signed)
   THERAPIST PROGRESS NOTE  Session Time: 11:30am-12:20pm  Participation Level: Active  Behavioral Response: Well GroomedAlertAnxious, Depressed and Irritable  Type of Therapy: Individual Therapy  Treatment Goals addressed: Coping  Interventions: CBT, Strength-based, Supportive and Reframing  Summary: [name] is a 37 y.o. female who presents with depressed mood and constricted affect. She reports slight improvement in her depression, but is still experiencing mood lability.  She has not had any days where she wants to stay in bed. She is frustrated with her low energy due to MS and feels guilty when she cannot play with her children more. She does not want her children to blame her for limitations in their lives. She discusses raising a special needs child and how challenging this is for her because of her MS and because her husband is out of town. She is angry with her husband and is disconnecting from him. She does not feel supported by him. She does not tell him how she feels anymore because she is too disconnected. She admits that they have poor communication and poor conflict resolution.   Suicidal/Homicidal: Nowithout intent/plan  Therapist Response: Assessed patients current functioning and reviewed progress. Reviewed coping strategies. Assessed patients safety and assisted in identifying protective factors.  Reviewed crisis plan with patient. Assisted patient with the expression of her feelings of frustration in her marriage . Used CBT to assist patient with the identification of negative distortions and irrational thoughts. Encouraged patient to verbalize alternative and factual responses which challenge thought distortions. Patient expresses self doubt, easily engages in negative self talk and distorted internal dialogue. Reviewed patients self care plan. Assessed  progress related to self care. Patient's self care is good. Recommend proper diet, regular exercise, socialization  and recreation.   Plan: Return again in one weeks.  Diagnosis: Axis I: Major Depression, single episode    Axis II: No diagnosis    Dedria Endres, LCSW 03/23/2012

## 2012-03-24 ENCOUNTER — Ambulatory Visit (HOSPITAL_COMMUNITY): Payer: Self-pay | Admitting: Psychiatry

## 2012-03-30 ENCOUNTER — Ambulatory Visit (HOSPITAL_COMMUNITY): Payer: Self-pay | Admitting: Licensed Clinical Social Worker

## 2012-03-31 ENCOUNTER — Ambulatory Visit (HOSPITAL_COMMUNITY): Payer: Self-pay | Admitting: Psychiatry

## 2012-04-06 ENCOUNTER — Encounter (HOSPITAL_COMMUNITY): Payer: Self-pay

## 2012-04-06 ENCOUNTER — Ambulatory Visit (HOSPITAL_COMMUNITY): Payer: Self-pay | Admitting: Licensed Clinical Social Worker

## 2012-04-16 DIAGNOSIS — H04129 Dry eye syndrome of unspecified lacrimal gland: Secondary | ICD-10-CM | POA: Diagnosis not present

## 2012-04-27 ENCOUNTER — Encounter (HOSPITAL_COMMUNITY): Payer: Self-pay | Admitting: Psychiatry

## 2012-04-27 ENCOUNTER — Ambulatory Visit (INDEPENDENT_AMBULATORY_CARE_PROVIDER_SITE_OTHER): Payer: PRIVATE HEALTH INSURANCE | Admitting: Psychiatry

## 2012-04-27 VITALS — BP 134/92 | HR 74 | Wt 191.6 lb

## 2012-04-27 DIAGNOSIS — F329 Major depressive disorder, single episode, unspecified: Secondary | ICD-10-CM | POA: Diagnosis not present

## 2012-04-27 MED ORDER — BUPROPION HCL ER (XL) 300 MG PO TB24: 300.0000 mg | ORAL_TABLET | ORAL | Status: DC

## 2012-04-27 MED ORDER — LISDEXAMFETAMINE DIMESYLATE 40 MG PO CAPS: 40.0000 mg | ORAL_CAPSULE | ORAL | Status: DC

## 2012-04-27 NOTE — Progress Notes (Signed)
[facility] [id number] Progress Note  [name] [id number] 37 y.o.  04/27/2012 10:29 AM  Chief Complaint: I'm doing so-so.  I cannot afford Vyvanse 30 pills a month.  I'm taking as needed.  History of Present Illness: Patient is 37 year old Caucasian female who came for her followup appointment.  She was last seen on January 17.  Patient endorse feeling tired and decreased energy.  She admitted not taking Vyvanse every day as she cannot afford 30 pills a month.  She is taking as needed.  She usually take 2-3 a week.  She also not taking the medication for multiple sclerosis as she develops side effects including stomach issues.  She scheduled to see her neurologist Dr. Rebecka Apley but she did not have any appointment.  She denies any crying spells or any worsening of the depression.  However she admitted decreased energy and feeling tired.  She also admitted drinking on and off but denies any binge drinking and overall she had cut down her drinking since she is taking Vyvanse.  She's compliant with Wellbutrin.  Her weight appetite and vital signs are stable she sleeps fine.  She is seeing therapist for coping and social skills.  She is hoping to have Vyvanse filled 30 pills as she met her detuctable.   Suicidal Ideation: No Plan Formed: No Patient has means to carry out plan: No  Homicidal Ideation: No Plan Formed: No Patient has means to carry out plan: No  Review of Systems: Psychiatric: Agitation: No Hallucination: No Depressed Mood: Yes Insomnia: No Hypersomnia: No Altered Concentration: No Feels Worthless: No Grandiose Ideas: No Belief In Special Powers: No New/Increased Substance Abuse: No Compulsions: No  Neurologic: Headache: Yes Seizure: No Paresthesias: Patient has multiple sclerosis.  She feel some time fatigue.  Medical  history . Patient has multiple sclerosis.  She see Dr. Epimenio Foot   Family, Social History: Patient was born and raised in [place].  She  moved to [place] 13 years ago.  She's been married for 10 years.  She has 2 daughters.  Her older daughter has autism.  Her husband [place] money to [place] for his work.  Patient has a limited social network.  Patient admitted mother has history of depression.  Alcohol and substance use history Patient admitted history of drinking alcohol which has been less intense in quantity and frequency since she is taking psychotropic medication.      Outpatient Encounter Prescriptions as of 04/27/2012  Medication Sig Dispense Refill  . buPROPion (WELLBUTRIN XL) 300 MG 24 hr tablet Take 1 tablet (300 mg total) by mouth every morning.  30 tablet  1  . lisdexamfetamine (VYVANSE) 40 MG capsule Take 1 capsule (40 mg total) by mouth every morning.  30 capsule  0  . [DISCONTINUED] buPROPion (WELLBUTRIN XL) 300 MG 24 hr tablet Take 1 tablet (300 mg total) by mouth every morning.  30 tablet  1  . [DISCONTINUED] lisdexamfetamine (VYVANSE) 40 MG capsule Take 1 capsule (40 mg total) by mouth every morning.  30 capsule  0  . TECFIDERA 120 & 240 MG MISC        No facility-administered encounter medications on file as of 04/27/2012.    Past Psychiatric History/Hospitalization(s): Anxiety: Yes Bipolar Disorder: No Depression: Patient has history of depression.  In the past she was treated with Prozac by neurologist 5 years ago.  She was getting stimulants from primary care physician and neurologist.  In the past she had tried Ritalin and Adderall. Mania:  No Psychosis: No Schizophrenia: No Personality Disorder: No Hospitalization for psychiatric illness: No History of Electroconvulsive Shock Therapy: No Prior Suicide Attempts: No  Physical Exam: Constitutional:  BP 134/92  Pulse 74  Wt 191 lb 9.6 oz (86.909 kg)  General Appearance: alert, oriented, no acute distress, well nourished and Maintained fair eye contact.  She appears tired today.  She is well groomed and well  dressed.  Musculoskeletal: Strength & Muscle Tone: within normal limits and Complain of feeling tired. Gait & Station: normal Patient leans: N/A  Psychiatric: Speech (describe rate, volume, coherence, spontaneity, and abnormalities if any): Soft but clear and coherent with normal tone and volume.    Thought Process (describe rate, content, abstract reasoning, and computation): Logical and goal-directed.  No flight of ideas or any loose association.    Associations: Relevant and Intact  Thoughts: normal  Mental Status: Orientation: oriented to person, place, time/date and situation Mood & Affect: depressed affect and anxiety Attention Span & Concentration: Fair   Medical Decision Making (Choose Three): Established Problem, Stable/Improving (1), New Problem, with no additional work-up planned (3), Review of Last Therapy Session (1), Review of Medication Regimen & Side Effects (2) and Review of New Medication or Change in Dosage (2)  Assessment: Axis I: Depressive disorder NOS, rule out Maj. depressive disorder , alcohol abuse   Axis II: Deferred   Axis III: See medical history  Patient Active Problem List  Diagnosis  . MS (multiple sclerosis)  . Depression, major, single episode, severe    Axis IV: Mild to moderate   Axis V: 60-65    Plan: Recommend to continue Vyvanse every day to help her increase attention concentration and energy level.  Patient agreed and she will fill up the prescription for 30 tablets as she believe that she met the deductibles .  I will continue Wellbutrin XL 300 mg daily.  We talk about risk and benefits of medication including continue drinking however patient promised that she will cut down further drinking.  She has cut down her drinking from the past.  Recommend to call us back if she is any question or concern if she feels worsening of the symptom.  Recommend to see therapist for coping and social skills.  Time spent 25 minutes.  More than 50% of  the time spent and psychoeducation counseling and [facility] of care.  Followup in 2 months.    Shandiin Eisenbeis T., MD 04/27/2012

## 2012-05-11 ENCOUNTER — Ambulatory Visit (INDEPENDENT_AMBULATORY_CARE_PROVIDER_SITE_OTHER): Payer: Medicare Other | Admitting: Licensed Clinical Social Worker

## 2012-05-11 DIAGNOSIS — F322 Major depressive disorder, single episode, severe without psychotic features: Secondary | ICD-10-CM | POA: Diagnosis not present

## 2012-05-11 NOTE — Progress Notes (Signed)
   THERAPIST PROGRESS NOTE  Session Time: 10:30am-11:20am  Participation Level: Active  Behavioral Response: Well GroomedAlertAnxious and Depressed  Type of Therapy: Individual Therapy  Treatment Goals addressed: Coping  Interventions: CBT, Strength-based, Assertiveness Training, Supportive and Reframing  Summary: [name] is a 37 y.o. female who presents with depressed mood and flat affect. She appears fatigued and reports feeling very frustrated with her husband lately. She endorses continued depression, anxiety and frustration. She reports having an argument with her husband which she feels was slightly productive. He refuses to attend marital counseling with her and she processes her anger over this. She feels hopeless about her marriage sometimes, mainly due to her husbands procrastination and "dreamer mentality". She wants to put the house on the market but her husband is not helping get it ready. Her MS has been stable lately, without any flare ups. She does admit that she has disconnected herself from her husband and was pleased that he confronted on this during their argument. Her sleep and appetite are wnl.    Suicidal/Homicidal: Nowithout intent/plan  Therapist Response: Assessed patients current functioning and reviewed progress. Reviewed coping strategies. Assessed patients safety and assisted in identifying protective factors.  Reviewed crisis plan with patient. Assisted patient with the expression of frustration. Reviewed patients self care plan. Assessed progress related to self care. Patients self care is good. Recommend daily exercise, increased socialization and recreation. Used CBT to assist patient with the identification of negative distortions and irrational thoughts. Encouraged patient to verbalize alternative and factual responses which challenge thought distortions. Reviewed healthy boundaries and assertive communication. Used motivational interviewing to assist  and encourage patient through the change process. Explored patients barriers to change. Patient's conflict avoidance prevents her from developing distress tolerance to her husbands response when she confronts him. Used DBT to practice mindfulness, review distraction list and improve distress tolerance skills.   Plan: Return again in one weeks.  Diagnosis: Axis I: Major Depression, single episode    Axis II: No diagnosis    Keashia Haskins, LCSW 05/11/2012

## 2012-05-13 ENCOUNTER — Other Ambulatory Visit (HOSPITAL_COMMUNITY): Payer: Self-pay | Admitting: *Deleted

## 2012-05-13 DIAGNOSIS — F329 Major depressive disorder, single episode, unspecified: Secondary | ICD-10-CM

## 2012-05-13 MED ORDER — BUPROPION HCL ER (XL) 300 MG PO TB24: 300.0000 mg | ORAL_TABLET | ORAL | Status: DC

## 2012-05-13 NOTE — Telephone Encounter (Signed)
Pt left VM:RX for Wellbutrin was sent to pharmacy per Dr.Arfeen last appt [3/17].Per pharmacy, no RX received. Contacted pharmacy: Per Sam at pharmacy, no record of Wellbutrin XL recv'd as "PHONE IN" or Escript on 3/17 or since that date.Last RX recorded 02/28/12. RX resent today as indicated in progress note 04/27/12

## 2012-05-18 ENCOUNTER — Ambulatory Visit (INDEPENDENT_AMBULATORY_CARE_PROVIDER_SITE_OTHER): Payer: Medicare Other | Admitting: Licensed Clinical Social Worker

## 2012-05-18 DIAGNOSIS — F322 Major depressive disorder, single episode, severe without psychotic features: Secondary | ICD-10-CM

## 2012-05-18 NOTE — Progress Notes (Signed)
   THERAPIST PROGRESS NOTE  Session Time: 9:30am-10:20am  Participation Level: Active  Behavioral Response: Well GroomedLethargicAnxious and Depressed  Type of Therapy: Individual Therapy  Treatment Goals addressed: Anxiety and Coping  Interventions: CBT, Motivational Interviewing, Strength-based, Supportive and Reframing  Summary: [name] is a 37 y.o. female who presents with depressed mood and tearful affect. She reports ongoing depression, anxiety, racing negative thoughts, poor motivation, anhedonia, disorganization, feeling overwhelmed easily, guilt and shame and poor focus. She is tearful when processing how difficult her life feels right now. She endorses feeling guilty that she is not a good enough mother and does not do enough for her children. She compares herself to unrealistic expectations of societal pressure and does feel better when she talks to her friends and hears they have the same struggle. She remains very frustrated with her husband and his lack of communication. She reports that her house is a mess and she is overwhelmed to tears by laundry on the couch and the mess in all the rooms. She will travel to [place] this weekend and processes her anxiety about dealing with her mother who makes her feel guilty when she doesn't spend all her time with her. Her sleep and appetite are wnl.    Suicidal/Homicidal: Nowithout intent/plan  Therapist Response: Assessed patients current functioning and reviewed progress. Reviewed coping strategies. Assessed patients safety and assisted in identifying protective factors.  Reviewed crisis plan with patient. Assisted patient with the expression of guilt and anxiety. Reviewed patients self care plan. Assessed progress related to self care. Patients self care is fair. Recommend daily exercise, increased socialization and recreation. Reviewed healthy boundaries and assertive communication. Used CBT to assist patient with the identification of  negative distortions and irrational thoughts. Encouraged patient to verbalize alternative and factual responses which challenge thought distortions. Used motivational interviewing to assist and encourage patient through the change process. Explored patients barriers to change. Provided feedback to normalize patients experience. Assisted her with setting goals, involving using a timer to stay focused. Explored ways in which she can assertively express her needs.   Plan: Return again in two weeks.  Diagnosis: Axis I: Major Depression, single episode    Axis II: No diagnosis    Ben Sanz, LCSW 05/18/2012

## 2012-05-26 ENCOUNTER — Ambulatory Visit (HOSPITAL_COMMUNITY): Payer: Self-pay | Admitting: Licensed Clinical Social Worker

## 2012-06-29 ENCOUNTER — Ambulatory Visit (HOSPITAL_COMMUNITY): Payer: Self-pay | Admitting: Psychiatry

## 2012-07-08 DIAGNOSIS — G35 Multiple sclerosis: Secondary | ICD-10-CM | POA: Diagnosis not present

## 2012-07-08 DIAGNOSIS — R209 Unspecified disturbances of skin sensation: Secondary | ICD-10-CM | POA: Diagnosis not present

## 2012-07-08 DIAGNOSIS — R269 Unspecified abnormalities of gait and mobility: Secondary | ICD-10-CM | POA: Diagnosis not present

## 2012-07-09 DIAGNOSIS — G35 Multiple sclerosis: Secondary | ICD-10-CM | POA: Diagnosis not present

## 2012-07-10 DIAGNOSIS — R269 Unspecified abnormalities of gait and mobility: Secondary | ICD-10-CM | POA: Diagnosis not present

## 2012-07-10 DIAGNOSIS — G35 Multiple sclerosis: Secondary | ICD-10-CM | POA: Diagnosis not present

## 2012-07-10 DIAGNOSIS — R209 Unspecified disturbances of skin sensation: Secondary | ICD-10-CM | POA: Diagnosis not present

## 2012-07-13 DIAGNOSIS — I776 Arteritis, unspecified: Secondary | ICD-10-CM | POA: Diagnosis not present

## 2012-07-20 DIAGNOSIS — M999 Biomechanical lesion, unspecified: Secondary | ICD-10-CM | POA: Diagnosis not present

## 2012-07-20 DIAGNOSIS — S139XXA Sprain of joints and ligaments of unspecified parts of neck, initial encounter: Secondary | ICD-10-CM | POA: Diagnosis not present

## 2012-07-20 DIAGNOSIS — S335XXA Sprain of ligaments of lumbar spine, initial encounter: Secondary | ICD-10-CM | POA: Diagnosis not present

## 2012-07-20 DIAGNOSIS — S338XXA Sprain of other parts of lumbar spine and pelvis, initial encounter: Secondary | ICD-10-CM | POA: Diagnosis not present

## 2012-07-20 DIAGNOSIS — S239XXA Sprain of unspecified parts of thorax, initial encounter: Secondary | ICD-10-CM | POA: Diagnosis not present

## 2012-07-20 DIAGNOSIS — M9981 Other biomechanical lesions of cervical region: Secondary | ICD-10-CM | POA: Diagnosis not present

## 2012-07-31 DIAGNOSIS — IMO0002 Reserved for concepts with insufficient information to code with codable children: Secondary | ICD-10-CM | POA: Diagnosis not present

## 2012-08-04 DIAGNOSIS — S335XXA Sprain of ligaments of lumbar spine, initial encounter: Secondary | ICD-10-CM | POA: Diagnosis not present

## 2012-08-04 DIAGNOSIS — M9981 Other biomechanical lesions of cervical region: Secondary | ICD-10-CM | POA: Diagnosis not present

## 2012-08-04 DIAGNOSIS — S139XXA Sprain of joints and ligaments of unspecified parts of neck, initial encounter: Secondary | ICD-10-CM | POA: Diagnosis not present

## 2012-08-04 DIAGNOSIS — S239XXA Sprain of unspecified parts of thorax, initial encounter: Secondary | ICD-10-CM | POA: Diagnosis not present

## 2012-08-04 DIAGNOSIS — S338XXA Sprain of other parts of lumbar spine and pelvis, initial encounter: Secondary | ICD-10-CM | POA: Diagnosis not present

## 2012-08-04 DIAGNOSIS — M999 Biomechanical lesion, unspecified: Secondary | ICD-10-CM | POA: Diagnosis not present

## 2012-08-21 DIAGNOSIS — R51 Headache: Secondary | ICD-10-CM | POA: Diagnosis not present

## 2012-08-27 DIAGNOSIS — S335XXA Sprain of ligaments of lumbar spine, initial encounter: Secondary | ICD-10-CM | POA: Diagnosis not present

## 2012-08-27 DIAGNOSIS — S338XXA Sprain of other parts of lumbar spine and pelvis, initial encounter: Secondary | ICD-10-CM | POA: Diagnosis not present

## 2012-08-27 DIAGNOSIS — S239XXA Sprain of unspecified parts of thorax, initial encounter: Secondary | ICD-10-CM | POA: Diagnosis not present

## 2012-08-27 DIAGNOSIS — M999 Biomechanical lesion, unspecified: Secondary | ICD-10-CM | POA: Diagnosis not present

## 2012-08-27 DIAGNOSIS — S139XXA Sprain of joints and ligaments of unspecified parts of neck, initial encounter: Secondary | ICD-10-CM | POA: Diagnosis not present

## 2012-08-27 DIAGNOSIS — M9981 Other biomechanical lesions of cervical region: Secondary | ICD-10-CM | POA: Diagnosis not present

## 2012-10-15 DIAGNOSIS — S139XXA Sprain of joints and ligaments of unspecified parts of neck, initial encounter: Secondary | ICD-10-CM | POA: Diagnosis not present

## 2012-10-15 DIAGNOSIS — M9981 Other biomechanical lesions of cervical region: Secondary | ICD-10-CM | POA: Diagnosis not present

## 2012-10-15 DIAGNOSIS — S335XXA Sprain of ligaments of lumbar spine, initial encounter: Secondary | ICD-10-CM | POA: Diagnosis not present

## 2012-10-15 DIAGNOSIS — S338XXA Sprain of other parts of lumbar spine and pelvis, initial encounter: Secondary | ICD-10-CM | POA: Diagnosis not present

## 2012-10-15 DIAGNOSIS — S239XXA Sprain of unspecified parts of thorax, initial encounter: Secondary | ICD-10-CM | POA: Diagnosis not present

## 2012-10-15 DIAGNOSIS — M999 Biomechanical lesion, unspecified: Secondary | ICD-10-CM | POA: Diagnosis not present

## 2012-10-19 DIAGNOSIS — S335XXA Sprain of ligaments of lumbar spine, initial encounter: Secondary | ICD-10-CM | POA: Diagnosis not present

## 2012-10-19 DIAGNOSIS — M9981 Other biomechanical lesions of cervical region: Secondary | ICD-10-CM | POA: Diagnosis not present

## 2012-10-19 DIAGNOSIS — S139XXA Sprain of joints and ligaments of unspecified parts of neck, initial encounter: Secondary | ICD-10-CM | POA: Diagnosis not present

## 2012-10-19 DIAGNOSIS — M999 Biomechanical lesion, unspecified: Secondary | ICD-10-CM | POA: Diagnosis not present

## 2012-10-19 DIAGNOSIS — S239XXA Sprain of unspecified parts of thorax, initial encounter: Secondary | ICD-10-CM | POA: Diagnosis not present

## 2012-10-19 DIAGNOSIS — S338XXA Sprain of other parts of lumbar spine and pelvis, initial encounter: Secondary | ICD-10-CM | POA: Diagnosis not present

## 2012-10-22 DIAGNOSIS — S335XXA Sprain of ligaments of lumbar spine, initial encounter: Secondary | ICD-10-CM | POA: Diagnosis not present

## 2012-10-22 DIAGNOSIS — S338XXA Sprain of other parts of lumbar spine and pelvis, initial encounter: Secondary | ICD-10-CM | POA: Diagnosis not present

## 2012-10-22 DIAGNOSIS — M9981 Other biomechanical lesions of cervical region: Secondary | ICD-10-CM | POA: Diagnosis not present

## 2012-10-22 DIAGNOSIS — S239XXA Sprain of unspecified parts of thorax, initial encounter: Secondary | ICD-10-CM | POA: Diagnosis not present

## 2012-10-22 DIAGNOSIS — S139XXA Sprain of joints and ligaments of unspecified parts of neck, initial encounter: Secondary | ICD-10-CM | POA: Diagnosis not present

## 2012-10-22 DIAGNOSIS — M999 Biomechanical lesion, unspecified: Secondary | ICD-10-CM | POA: Diagnosis not present

## 2012-11-04 DIAGNOSIS — S338XXA Sprain of other parts of lumbar spine and pelvis, initial encounter: Secondary | ICD-10-CM | POA: Diagnosis not present

## 2012-11-04 DIAGNOSIS — S335XXA Sprain of ligaments of lumbar spine, initial encounter: Secondary | ICD-10-CM | POA: Diagnosis not present

## 2012-11-04 DIAGNOSIS — S239XXA Sprain of unspecified parts of thorax, initial encounter: Secondary | ICD-10-CM | POA: Diagnosis not present

## 2012-11-04 DIAGNOSIS — M9981 Other biomechanical lesions of cervical region: Secondary | ICD-10-CM | POA: Diagnosis not present

## 2012-11-04 DIAGNOSIS — S139XXA Sprain of joints and ligaments of unspecified parts of neck, initial encounter: Secondary | ICD-10-CM | POA: Diagnosis not present

## 2012-11-04 DIAGNOSIS — M999 Biomechanical lesion, unspecified: Secondary | ICD-10-CM | POA: Diagnosis not present

## 2012-11-10 DIAGNOSIS — M999 Biomechanical lesion, unspecified: Secondary | ICD-10-CM | POA: Diagnosis not present

## 2012-11-10 DIAGNOSIS — S239XXA Sprain of unspecified parts of thorax, initial encounter: Secondary | ICD-10-CM | POA: Diagnosis not present

## 2012-11-10 DIAGNOSIS — S335XXA Sprain of ligaments of lumbar spine, initial encounter: Secondary | ICD-10-CM | POA: Diagnosis not present

## 2012-11-10 DIAGNOSIS — S338XXA Sprain of other parts of lumbar spine and pelvis, initial encounter: Secondary | ICD-10-CM | POA: Diagnosis not present

## 2012-11-10 DIAGNOSIS — S139XXA Sprain of joints and ligaments of unspecified parts of neck, initial encounter: Secondary | ICD-10-CM | POA: Diagnosis not present

## 2012-11-10 DIAGNOSIS — M9981 Other biomechanical lesions of cervical region: Secondary | ICD-10-CM | POA: Diagnosis not present

## 2012-11-17 DIAGNOSIS — S338XXA Sprain of other parts of lumbar spine and pelvis, initial encounter: Secondary | ICD-10-CM | POA: Diagnosis not present

## 2012-11-17 DIAGNOSIS — S139XXA Sprain of joints and ligaments of unspecified parts of neck, initial encounter: Secondary | ICD-10-CM | POA: Diagnosis not present

## 2012-11-17 DIAGNOSIS — M999 Biomechanical lesion, unspecified: Secondary | ICD-10-CM | POA: Diagnosis not present

## 2012-11-17 DIAGNOSIS — S239XXA Sprain of unspecified parts of thorax, initial encounter: Secondary | ICD-10-CM | POA: Diagnosis not present

## 2012-11-17 DIAGNOSIS — S335XXA Sprain of ligaments of lumbar spine, initial encounter: Secondary | ICD-10-CM | POA: Diagnosis not present

## 2012-11-17 DIAGNOSIS — M9981 Other biomechanical lesions of cervical region: Secondary | ICD-10-CM | POA: Diagnosis not present

## 2012-11-24 DIAGNOSIS — M999 Biomechanical lesion, unspecified: Secondary | ICD-10-CM | POA: Diagnosis not present

## 2012-11-24 DIAGNOSIS — M9981 Other biomechanical lesions of cervical region: Secondary | ICD-10-CM | POA: Diagnosis not present

## 2012-11-24 DIAGNOSIS — S338XXA Sprain of other parts of lumbar spine and pelvis, initial encounter: Secondary | ICD-10-CM | POA: Diagnosis not present

## 2012-11-24 DIAGNOSIS — S335XXA Sprain of ligaments of lumbar spine, initial encounter: Secondary | ICD-10-CM | POA: Diagnosis not present

## 2012-11-24 DIAGNOSIS — S139XXA Sprain of joints and ligaments of unspecified parts of neck, initial encounter: Secondary | ICD-10-CM | POA: Diagnosis not present

## 2012-11-24 DIAGNOSIS — S239XXA Sprain of unspecified parts of thorax, initial encounter: Secondary | ICD-10-CM | POA: Diagnosis not present

## 2012-12-08 DIAGNOSIS — M999 Biomechanical lesion, unspecified: Secondary | ICD-10-CM | POA: Diagnosis not present

## 2012-12-08 DIAGNOSIS — S139XXA Sprain of joints and ligaments of unspecified parts of neck, initial encounter: Secondary | ICD-10-CM | POA: Diagnosis not present

## 2012-12-08 DIAGNOSIS — S338XXA Sprain of other parts of lumbar spine and pelvis, initial encounter: Secondary | ICD-10-CM | POA: Diagnosis not present

## 2012-12-08 DIAGNOSIS — S335XXA Sprain of ligaments of lumbar spine, initial encounter: Secondary | ICD-10-CM | POA: Diagnosis not present

## 2012-12-08 DIAGNOSIS — M9981 Other biomechanical lesions of cervical region: Secondary | ICD-10-CM | POA: Diagnosis not present

## 2012-12-08 DIAGNOSIS — S239XXA Sprain of unspecified parts of thorax, initial encounter: Secondary | ICD-10-CM | POA: Diagnosis not present

## 2012-12-23 DIAGNOSIS — S239XXA Sprain of unspecified parts of thorax, initial encounter: Secondary | ICD-10-CM | POA: Diagnosis not present

## 2012-12-23 DIAGNOSIS — S139XXA Sprain of joints and ligaments of unspecified parts of neck, initial encounter: Secondary | ICD-10-CM | POA: Diagnosis not present

## 2012-12-23 DIAGNOSIS — M9981 Other biomechanical lesions of cervical region: Secondary | ICD-10-CM | POA: Diagnosis not present

## 2012-12-23 DIAGNOSIS — S335XXA Sprain of ligaments of lumbar spine, initial encounter: Secondary | ICD-10-CM | POA: Diagnosis not present

## 2012-12-23 DIAGNOSIS — M999 Biomechanical lesion, unspecified: Secondary | ICD-10-CM | POA: Diagnosis not present

## 2012-12-23 DIAGNOSIS — S338XXA Sprain of other parts of lumbar spine and pelvis, initial encounter: Secondary | ICD-10-CM | POA: Diagnosis not present

## 2013-01-11 DIAGNOSIS — Z79899 Other long term (current) drug therapy: Secondary | ICD-10-CM | POA: Diagnosis not present

## 2013-01-11 DIAGNOSIS — G35 Multiple sclerosis: Secondary | ICD-10-CM | POA: Diagnosis not present

## 2013-03-25 DIAGNOSIS — Q828 Other specified congenital malformations of skin: Secondary | ICD-10-CM | POA: Diagnosis not present

## 2013-03-25 DIAGNOSIS — D235 Other benign neoplasm of skin of trunk: Secondary | ICD-10-CM | POA: Diagnosis not present

## 2013-03-25 DIAGNOSIS — L578 Other skin changes due to chronic exposure to nonionizing radiation: Secondary | ICD-10-CM | POA: Diagnosis not present

## 2013-03-25 DIAGNOSIS — D485 Neoplasm of uncertain behavior of skin: Secondary | ICD-10-CM | POA: Diagnosis not present

## 2013-03-25 DIAGNOSIS — D239 Other benign neoplasm of skin, unspecified: Secondary | ICD-10-CM | POA: Diagnosis not present

## 2013-03-25 DIAGNOSIS — L819 Disorder of pigmentation, unspecified: Secondary | ICD-10-CM | POA: Diagnosis not present

## 2013-03-25 DIAGNOSIS — L821 Other seborrheic keratosis: Secondary | ICD-10-CM | POA: Diagnosis not present

## 2013-04-20 DIAGNOSIS — G35 Multiple sclerosis: Secondary | ICD-10-CM | POA: Diagnosis not present

## 2013-04-20 DIAGNOSIS — Z79899 Other long term (current) drug therapy: Secondary | ICD-10-CM | POA: Diagnosis not present

## 2013-05-13 DIAGNOSIS — F331 Major depressive disorder, recurrent, moderate: Secondary | ICD-10-CM | POA: Diagnosis not present

## 2013-06-09 DIAGNOSIS — G35 Multiple sclerosis: Secondary | ICD-10-CM | POA: Diagnosis not present

## 2013-06-09 DIAGNOSIS — F331 Major depressive disorder, recurrent, moderate: Secondary | ICD-10-CM | POA: Diagnosis not present

## 2013-06-16 DIAGNOSIS — F331 Major depressive disorder, recurrent, moderate: Secondary | ICD-10-CM | POA: Diagnosis not present

## 2013-06-23 DIAGNOSIS — F331 Major depressive disorder, recurrent, moderate: Secondary | ICD-10-CM | POA: Diagnosis not present

## 2013-07-20 DIAGNOSIS — G35 Multiple sclerosis: Secondary | ICD-10-CM | POA: Diagnosis not present

## 2013-07-20 DIAGNOSIS — R209 Unspecified disturbances of skin sensation: Secondary | ICD-10-CM | POA: Diagnosis not present

## 2013-08-04 DIAGNOSIS — N898 Other specified noninflammatory disorders of vagina: Secondary | ICD-10-CM | POA: Diagnosis not present

## 2013-08-04 DIAGNOSIS — Z01419 Encounter for gynecological examination (general) (routine) without abnormal findings: Secondary | ICD-10-CM | POA: Diagnosis not present

## 2013-08-04 DIAGNOSIS — Z124 Encounter for screening for malignant neoplasm of cervix: Secondary | ICD-10-CM | POA: Diagnosis not present

## 2013-12-19 ENCOUNTER — Emergency Department (HOSPITAL_COMMUNITY)
Admission: EM | Admit: 2013-12-19 | Discharge: 2013-12-19 | Disposition: A | Discharge status: Home / Self Care | Payer: Medicare Other | Attending: Emergency Medicine | Admitting: Emergency Medicine

## 2013-12-19 DIAGNOSIS — Z720 Tobacco use: Secondary | ICD-10-CM | POA: Diagnosis not present

## 2013-12-19 DIAGNOSIS — Z79899 Other long term (current) drug therapy: Secondary | ICD-10-CM | POA: Diagnosis not present

## 2013-12-19 DIAGNOSIS — H5711 Ocular pain, right eye: Secondary | ICD-10-CM | POA: Diagnosis not present

## 2013-12-19 DIAGNOSIS — G35 Multiple sclerosis: Secondary | ICD-10-CM | POA: Diagnosis not present

## 2013-12-19 DIAGNOSIS — F329 Major depressive disorder, single episode, unspecified: Secondary | ICD-10-CM | POA: Diagnosis not present

## 2013-12-19 DIAGNOSIS — F419 Anxiety disorder, unspecified: Secondary | ICD-10-CM | POA: Insufficient documentation

## 2013-12-19 MED ORDER — SODIUM CHLORIDE 0.9 % IV SOLN
1000.0000 mg | Freq: Once | INTRAVENOUS | Status: AC
  Administered 2013-12-19: 1000 mg via INTRAVENOUS
  Filled 2013-12-19 (×2): qty 8

## 2013-12-19 NOTE — ED Notes (Signed)
Pt reports having MS and feels like she is having a relapse. Reports pain to right eye over the past few days and now having blurred vision to right eye and feels slightly "cognitively impaired." no acute distress noted at triage.

## 2013-12-19 NOTE — ED Provider Notes (Signed)
CSN: [id number]     Arrival date & time 12/19/13  1337 History   First MD Initiated Contact with Patient 12/19/13 1350     Chief Complaint  Patient presents with  . Eye Pain  . Blurred Vision     (Consider location/radiation/quality/duration/timing/severity/associated sxs/prior Treatment) Patient is a 38 y.o. female presenting with eye pain.  Eye Pain This is a recurrent problem. Episode onset: 5 days ago. The problem occurs constantly. The problem has been gradually worsening. Pertinent negatives include no chest pain, no abdominal pain, no headaches and no shortness of breath. Exacerbated by: eye movement. Nothing relieves the symptoms. She has tried nothing for the symptoms.    Past Medical History  Diagnosis Date  . MS (multiple sclerosis)     Diagnosed in 2005  . Depression   . MS (multiple sclerosis)   . Anxiety    No past surgical history on file. Family History  Problem Relation Age of Onset  . Depression Mother    History  Substance Use Topics  . Smoking status: Current Every Day Smoker    Types: Cigarettes  . Smokeless tobacco: Not on file  . Alcohol Use: Yes   OB History    No data available     Review of Systems  Eyes: Positive for pain and visual disturbance.  Respiratory: Negative for shortness of breath.   Cardiovascular: Negative for chest pain.  Gastrointestinal: Negative for abdominal pain.  Neurological: Negative for headaches.  All other systems reviewed and are negative.     Allergies  Review of patient's allergies indicates no known allergies.  Home Medications   Prior to Admission medications   Medication Sig Start Date End Date Taking? Authorizing Provider  amphetamine-dextroamphetamine (ADDERALL) 5 MG tablet Take 5 mg by mouth daily as needed (add).   Yes Historical Provider, MD  buPROPion (WELLBUTRIN XL) 150 MG 24 hr tablet Take 150 mg by mouth daily.  11/11/13  Yes Historical Provider, MD  escitalopram (LEXAPRO) 20 MG tablet Take  20 mg by mouth daily.  11/11/13  Yes Historical Provider, MD  lisdexamfetamine (VYVANSE) 40 MG capsule Take 1 capsule (40 mg total) by mouth every morning. Patient taking differently: Take 40 mg by mouth daily as needed (adhd).  04/27/12  Yes Kathlee Nations, MD  TECFIDERA [place]  10/04/11   Historical Provider, MD   BP 137/84 mmHg  Pulse 80  Temp(Src) 98 F (36.7 C) (Oral)  Resp 16  SpO2 98%  LMP 12/14/2013 Physical Exam  Constitutional: She is oriented to person, place, and time. She appears well-developed and well-nourished.  HENT:  Head: Normocephalic and atraumatic.  Right Ear: External ear normal.  Left Ear: External ear normal.  Eyes: Conjunctivae and EOM are normal. Pupils are equal, round, and reactive to light.  Neck: Normal range of motion. Neck supple.  Cardiovascular: Normal rate, regular rhythm, normal heart sounds and intact distal pulses.   Pulmonary/Chest: Effort normal and breath sounds normal.  Abdominal: Soft. Bowel sounds are normal. There is no tenderness.  Musculoskeletal: Normal range of motion.  Neurological: She is alert and oriented to person, place, and time. She has normal strength and normal reflexes. No cranial nerve deficit or sensory deficit.  Skin: Skin is warm and dry.  Vitals reviewed.   ED Course  Procedures (including critical care time) Labs Review Labs Reviewed - No data to display  Imaging Review No results found.   EKG Interpretation None  MDM   Final diagnoses:  MS (multiple sclerosis)  Eye pain, right    38 y.o. female with pertinent PMH of MS presents with recurrent optic neuritis symptoms of eye pain with movement, blurred vision in L eye.  Visual acuity as documented in nursing note 20/25 OD and 20/30 OS.  No focal neuro deficits otherwise.  Spoke with primary neurologist team Dr. Felecia Shelling who recommended IV solumedrol and will have the pt fu in clinic tomorrow for daily dosing.  DC home in stable condition.     1. MS (multiple sclerosis)   2. Eye pain, right         Debby Freiberg, MD 12/22/13 1455

## 2013-12-19 NOTE — Discharge Instructions (Signed)

## 2013-12-20 DIAGNOSIS — R5383 Other fatigue: Secondary | ICD-10-CM | POA: Diagnosis not present

## 2013-12-20 DIAGNOSIS — H469 Unspecified optic neuritis: Secondary | ICD-10-CM | POA: Diagnosis not present

## 2013-12-20 DIAGNOSIS — G35 Multiple sclerosis: Secondary | ICD-10-CM | POA: Diagnosis not present

## 2013-12-21 DIAGNOSIS — G35 Multiple sclerosis: Secondary | ICD-10-CM | POA: Diagnosis not present

## 2013-12-22 DIAGNOSIS — G35 Multiple sclerosis: Secondary | ICD-10-CM | POA: Diagnosis not present

## 2013-12-22 DIAGNOSIS — Z79899 Other long term (current) drug therapy: Secondary | ICD-10-CM | POA: Diagnosis not present

## 2014-01-31 DIAGNOSIS — M5417 Radiculopathy, lumbosacral region: Secondary | ICD-10-CM | POA: Diagnosis not present

## 2014-01-31 DIAGNOSIS — M5414 Radiculopathy, thoracic region: Secondary | ICD-10-CM | POA: Diagnosis not present

## 2014-01-31 DIAGNOSIS — M9901 Segmental and somatic dysfunction of cervical region: Secondary | ICD-10-CM | POA: Diagnosis not present

## 2014-01-31 DIAGNOSIS — M9902 Segmental and somatic dysfunction of thoracic region: Secondary | ICD-10-CM | POA: Diagnosis not present

## 2014-01-31 DIAGNOSIS — M9903 Segmental and somatic dysfunction of lumbar region: Secondary | ICD-10-CM | POA: Diagnosis not present

## 2014-01-31 DIAGNOSIS — M531 Cervicobrachial syndrome: Secondary | ICD-10-CM | POA: Diagnosis not present

## 2014-02-01 DIAGNOSIS — M531 Cervicobrachial syndrome: Secondary | ICD-10-CM | POA: Diagnosis not present

## 2014-02-01 DIAGNOSIS — M9902 Segmental and somatic dysfunction of thoracic region: Secondary | ICD-10-CM | POA: Diagnosis not present

## 2014-02-01 DIAGNOSIS — M9901 Segmental and somatic dysfunction of cervical region: Secondary | ICD-10-CM | POA: Diagnosis not present

## 2014-02-01 DIAGNOSIS — M5414 Radiculopathy, thoracic region: Secondary | ICD-10-CM | POA: Diagnosis not present

## 2014-02-01 DIAGNOSIS — M5417 Radiculopathy, lumbosacral region: Secondary | ICD-10-CM | POA: Diagnosis not present

## 2014-02-01 DIAGNOSIS — M9903 Segmental and somatic dysfunction of lumbar region: Secondary | ICD-10-CM | POA: Diagnosis not present

## 2014-02-02 DIAGNOSIS — M5414 Radiculopathy, thoracic region: Secondary | ICD-10-CM | POA: Diagnosis not present

## 2014-02-02 DIAGNOSIS — M5417 Radiculopathy, lumbosacral region: Secondary | ICD-10-CM | POA: Diagnosis not present

## 2014-02-02 DIAGNOSIS — M9902 Segmental and somatic dysfunction of thoracic region: Secondary | ICD-10-CM | POA: Diagnosis not present

## 2014-02-02 DIAGNOSIS — M9903 Segmental and somatic dysfunction of lumbar region: Secondary | ICD-10-CM | POA: Diagnosis not present

## 2014-02-02 DIAGNOSIS — M9901 Segmental and somatic dysfunction of cervical region: Secondary | ICD-10-CM | POA: Diagnosis not present

## 2014-02-02 DIAGNOSIS — M531 Cervicobrachial syndrome: Secondary | ICD-10-CM | POA: Diagnosis not present

## 2014-02-16 DIAGNOSIS — M5414 Radiculopathy, thoracic region: Secondary | ICD-10-CM | POA: Diagnosis not present

## 2014-02-16 DIAGNOSIS — M9903 Segmental and somatic dysfunction of lumbar region: Secondary | ICD-10-CM | POA: Diagnosis not present

## 2014-02-16 DIAGNOSIS — M5417 Radiculopathy, lumbosacral region: Secondary | ICD-10-CM | POA: Diagnosis not present

## 2014-02-16 DIAGNOSIS — M9902 Segmental and somatic dysfunction of thoracic region: Secondary | ICD-10-CM | POA: Diagnosis not present

## 2014-02-16 DIAGNOSIS — M531 Cervicobrachial syndrome: Secondary | ICD-10-CM | POA: Diagnosis not present

## 2014-02-16 DIAGNOSIS — M9901 Segmental and somatic dysfunction of cervical region: Secondary | ICD-10-CM | POA: Diagnosis not present

## 2014-03-03 DIAGNOSIS — M5414 Radiculopathy, thoracic region: Secondary | ICD-10-CM | POA: Diagnosis not present

## 2014-03-03 DIAGNOSIS — M9902 Segmental and somatic dysfunction of thoracic region: Secondary | ICD-10-CM | POA: Diagnosis not present

## 2014-03-03 DIAGNOSIS — M531 Cervicobrachial syndrome: Secondary | ICD-10-CM | POA: Diagnosis not present

## 2014-03-03 DIAGNOSIS — M9903 Segmental and somatic dysfunction of lumbar region: Secondary | ICD-10-CM | POA: Diagnosis not present

## 2014-03-03 DIAGNOSIS — M5417 Radiculopathy, lumbosacral region: Secondary | ICD-10-CM | POA: Diagnosis not present

## 2014-03-03 DIAGNOSIS — M9901 Segmental and somatic dysfunction of cervical region: Secondary | ICD-10-CM | POA: Diagnosis not present

## 2014-09-08 DIAGNOSIS — F329 Major depressive disorder, single episode, unspecified: Secondary | ICD-10-CM | POA: Diagnosis not present

## 2014-09-08 DIAGNOSIS — G43909 Migraine, unspecified, not intractable, without status migrainosus: Secondary | ICD-10-CM | POA: Diagnosis not present

## 2014-09-08 DIAGNOSIS — G35 Multiple sclerosis: Secondary | ICD-10-CM | POA: Diagnosis not present

## 2014-09-08 DIAGNOSIS — I10 Essential (primary) hypertension: Secondary | ICD-10-CM | POA: Diagnosis not present

## 2014-10-18 DIAGNOSIS — G35 Multiple sclerosis: Secondary | ICD-10-CM | POA: Diagnosis not present

## 2014-10-18 DIAGNOSIS — I10 Essential (primary) hypertension: Secondary | ICD-10-CM | POA: Diagnosis not present

## 2014-10-18 DIAGNOSIS — F329 Major depressive disorder, single episode, unspecified: Secondary | ICD-10-CM | POA: Diagnosis not present

## 2014-12-19 DIAGNOSIS — I10 Essential (primary) hypertension: Secondary | ICD-10-CM | POA: Diagnosis not present

## 2014-12-19 DIAGNOSIS — F329 Major depressive disorder, single episode, unspecified: Secondary | ICD-10-CM | POA: Diagnosis not present

## 2014-12-19 DIAGNOSIS — G35 Multiple sclerosis: Secondary | ICD-10-CM | POA: Diagnosis not present

## 2014-12-19 DIAGNOSIS — Z0000 Encounter for general adult medical examination without abnormal findings: Secondary | ICD-10-CM | POA: Diagnosis not present

## 2014-12-19 DIAGNOSIS — Z136 Encounter for screening for cardiovascular disorders: Secondary | ICD-10-CM | POA: Diagnosis not present

## 2014-12-19 DIAGNOSIS — G43909 Migraine, unspecified, not intractable, without status migrainosus: Secondary | ICD-10-CM | POA: Diagnosis not present

## 2014-12-19 DIAGNOSIS — G4700 Insomnia, unspecified: Secondary | ICD-10-CM | POA: Diagnosis not present

## 2015-02-14 DIAGNOSIS — J329 Chronic sinusitis, unspecified: Secondary | ICD-10-CM | POA: Diagnosis not present

## 2015-02-14 DIAGNOSIS — B009 Herpesviral infection, unspecified: Secondary | ICD-10-CM | POA: Diagnosis not present

## 2015-03-14 DIAGNOSIS — H04129 Dry eye syndrome of unspecified lacrimal gland: Secondary | ICD-10-CM | POA: Diagnosis not present

## 2015-05-17 ENCOUNTER — Encounter: Payer: Self-pay | Admitting: Neurology

## 2015-05-17 ENCOUNTER — Ambulatory Visit (INDEPENDENT_AMBULATORY_CARE_PROVIDER_SITE_OTHER): Payer: Medicare Other | Admitting: Neurology

## 2015-05-17 VITALS — BP 146/88 | HR 78 | Resp 16 | Ht 66.0 in | Wt 230.0 lb

## 2015-05-17 DIAGNOSIS — F329 Major depressive disorder, single episode, unspecified: Secondary | ICD-10-CM | POA: Diagnosis not present

## 2015-05-17 DIAGNOSIS — R269 Unspecified abnormalities of gait and mobility: Secondary | ICD-10-CM

## 2015-05-17 DIAGNOSIS — R4189 Other symptoms and signs involving cognitive functions and awareness: Secondary | ICD-10-CM | POA: Diagnosis not present

## 2015-05-17 DIAGNOSIS — G4700 Insomnia, unspecified: Secondary | ICD-10-CM

## 2015-05-17 DIAGNOSIS — F32A Depression, unspecified: Secondary | ICD-10-CM | POA: Insufficient documentation

## 2015-05-17 DIAGNOSIS — F909 Attention-deficit hyperactivity disorder, unspecified type: Secondary | ICD-10-CM

## 2015-05-17 DIAGNOSIS — G35 Multiple sclerosis: Secondary | ICD-10-CM

## 2015-05-17 DIAGNOSIS — Z8669 Personal history of other diseases of the nervous system and sense organs: Secondary | ICD-10-CM | POA: Diagnosis not present

## 2015-05-17 DIAGNOSIS — F988 Other specified behavioral and emotional disorders with onset usually occurring in childhood and adolescence: Secondary | ICD-10-CM | POA: Insufficient documentation

## 2015-05-17 DIAGNOSIS — G4710 Hypersomnia, unspecified: Secondary | ICD-10-CM

## 2015-05-17 MED ORDER — FLUOXETINE HCL 40 MG PO CAPS: 40.0000 mg | ORAL_CAPSULE | Freq: Every day | ORAL | Status: DC

## 2015-05-17 MED ORDER — LISDEXAMFETAMINE DIMESYLATE 40 MG PO CAPS: 40.0000 mg | ORAL_CAPSULE | ORAL | Status: DC

## 2015-05-17 MED ORDER — AMPHETAMINE-DEXTROAMPHETAMINE 5 MG PO TABS: ORAL_TABLET | ORAL | Status: DC

## 2015-05-17 MED ORDER — TEMAZEPAM 15 MG PO CAPS: 15.0000 mg | ORAL_CAPSULE | Freq: Every evening | ORAL | Status: DC | PRN

## 2015-05-17 NOTE — Progress Notes (Addendum)
[facility]  PATIENT: [name] DOB: April 22, 1975  REFERRING DOCTOR OR PCP:  Kelton Pillar SOURCE: [patient, records from [facility], MRI images on PACS  _________________________________   HISTORICAL  CHIEF COMPLAINT:  Chief Complaint  Patient presents with  . Multiple Sclerosis    [name] is a former pt. of Dr. Garth Bigness from [facility].  She was dx. with MS in 2008.  Presenting sx. was left sided numbness/weakness, gait disturbance. Dx. confirmed with MRI.  No LP.  She initially saw Dr. Brett Fairy here at [facility] and was started on Copaxone, but stopped after about a yr. due to new lesions on MRI. She then started Betaseron  and was on this for about a yr. but stopped due to relapse.  She transferred care to Dr. Felecia Shelling and was started on Tysabri but stopped due to high JCV   . Memory Loss    titer.  She then tried Gilenya briefly but stopped due to cardiac/resp. complaints.  Next she was on Tecfidera briefly but stopped due to gi side effects.  Ste then started Aubagio which she tolerated well, but stopped (personal choice) around a yr. ago.  She has not seen a neurologist in over a yr.  Sts. memory, depression are worse.  She is under more stress due to divorce.  She would like to discuss restarting an MS med./fim    HISTORY OF PRESENT ILLNESS:  [name] Is a 40 year old woman with multiple sclerosis who I have previously seen at [facility]. She has been off any disease modifying therapy and has not seen any neurologist for over a year and feels that she is doing worse.    She is currently going through a divorce and having more stress.  MS History:    In 2008, she presented with left-sided numbness and weakness. She also difficulty with her gait. An MRI of the brain was consistent with multiple sclerosis. She was seeing Dr. Brett Fairy at the time.   =Initially, she was placed on Copaxone after she was diagnosed in 2008 but stopped  after a year due to MRI changes.   Then, she was placed on Betaseron but she had an exacerbation and was referred to me. We started  Tysabri in 2012. She did well on Tysabri but was high titer JCV antibody positive and stopped after a year or so.  She was started on Gilenya but stopped due to shortness of breath.  She then went on Tecfidera. She had difficulty tolerating it.    She had left greater than right arm numbness 07/05/2012 and received 3 days of IV Solu-Medrol. Of note, she had stopped Tecfidera 2 months earlier because of GI issues. MRI of the cervical spine in 2011 showed a focus adjacent to C3. An MRI performed May 2014 showed 3 enhancing lesions.   She switched to Aubagio.    An MRI of the brain 07/20/2013 at [place] was unchanged compared to a previous one from 07/10/2012.   She had an exacerbation 12/20/2013 with right optic neuritis and received several days of IV Solu-Medrol. Of note, she had run out of Aubagio a couple months earlier.     I personally reviewed the MRI of her brain from 05/03/2007 showing multiple T2/FLAIR hyperintense foci in a pattern and configuration consistent with chronic demyelinating plaque associated with MS. She also has a left frontal benign-appearing developmental venous anomaly.   I also reviewed an MRI of the cervical spine from 09/07/2006. There is a focus within  the posterior spinal cord adjacent to C3,. There are no significant degenerative changes.  Gait/strength/sensation: She denies any significant difficulties with her gait but balance is mildly off. There is mild left sided weakness that she notes most when climbing stairs.  . She does note some tingling in the fingertips bilaterally but not in the legs.  Vision: The episode of optic neuritis in 2015 optic. Currently, she denies any significant difficulties with visual acuity. There is no diplopia. There is no eye pain.  Bladder/bowel: She denies any difficulties with her bladder. Specifically,  there is not urgency, frequency, hesitancy. There is no constipation.  Fatigue/sleep:   She reports fatigue that is both physical and cognitive. She is also sleepy. In the past, she got some benefit with stimulants such as Adderall or Vyvanse. She has excessive daytime sleepiness.    A PSG 11/16/2010 showed no OSA. Sleep latency was delayed. MSLT showed hypersomnia with a sleep latency of 5.4 minutes and one sleep onset REM.   Another PSG March 2012 showed a reduced sleep latency of 6 minutes with 3 of 5 showing REM sleep.  The hypersomnia also improved with the stimulants. However, sometimes with the stimulants she would feel irritable or energy in the afternoons. Therefore, she would not take on a regular basis. She has insomnia at night. Once she falls asleep she will usually stay asleep but she has difficulty falling asleep on a nightly basis. In the past, she has tried Ambien and Xanax. She had a better benefit with Xanax that she did with the Ambien.      Mood:    She has had a problem with depression off and on for the past 10 years. She has been on several different anti-depressions with variable efficacy. She has recently been prescribed Prozac 20 mg but is not taking on a regular basis. She has a lot of of stress right now with health issues and family issues. She is going through a divorce. So far it has been mostly amicable. Her husband and her are still in the same house. She has a 57 and 48 year old child.       Cognitive:   She reports a lot of difficulty with cognition. There has been problems with short-term memory, attention, executive function and verbal fluency.   She focused better and carried out tasks better with stimulants in the past.  EPWORTH SLEEPINESS SCALE  On a scale of 0 - 3 what is the chance of dozing:  Sitting and Reading:   3 Watching TV:    3 Sitting inactive in a public place: 1 Passenger in car for one hour: 2 Lying down to rest in the afternoon: 3 Sitting and  talking to someone: 0 Sitting quietly after lunch:  1 In a car, stopped in traffic:  1  Total (out of 24):   14/24     REVIEW OF SYSTEMS: Constitutional: No fevers, chills, sweats, or change in appetite.   She has fatigue, daytime sleepiness and nighttime insomnia Eyes: No visual changes, double vision, eye pain Ear, nose and throat: No hearing loss, ear pain, nasal congestion, sore throat Cardiovascular: No chest pain, palpitations Respiratory: No shortness of breath at rest or with exertion.   No wheezes GastrointestinaI: No nausea, vomiting, diarrhea, abdominal pain, fecal incontinence Genitourinary: No dysuria, urinary retention or frequency.  No nocturia. Musculoskeletal: No neck pain, back pain Integumentary: No rash, pruritus, skin lesions Neurological: as above Psychiatric: As above Endocrine: No palpitations, diaphoresis, change in appetite,  change in weigh or increased thirst Hematologic/Lymphatic: No anemia, purpura, petechiae. Allergic/Immunologic: No itchy/runny eyes, nasal congestion, recent allergic reactions, rashes  ALLERGIES: No Known Allergies  HOME MEDICATIONS: No current outpatient prescriptions on file.  PAST MEDICAL HISTORY: Past Medical History  Diagnosis Date  . MS (multiple sclerosis) ([place])     Diagnosed in 2005  . Depression   . MS (multiple sclerosis) ([place])   . Anxiety     PAST SURGICAL HISTORY: No past surgical history on file.  FAMILY HISTORY: Family History  Problem Relation Age of Onset  . Depression Mother     SOCIAL HISTORY:  Social History   Social History  . Marital Status: Married    Spouse Name: N/A  . Number of Children: N/A  . Years of Education: N/A   Occupational History  . Not on file.   Social History Main Topics  . Smoking status: Former Smoker    Types: Cigarettes  . Smokeless tobacco: Not on file  . Alcohol Use: 0.0 oz/week    0 Standard drinks or equivalent per week     Comment: occasional/fim  .  Drug Use: No  . Sexual Activity: Not on file   Other Topics Concern  . Not on file   Social History Narrative     PHYSICAL EXAM  Filed Vitals:   05/17/15 1028  BP: 146/88  Pulse: 78  Resp: 16  Height: 5\' 6"  (1.676 m)  Weight: 230 lb (104.327 kg)    Body mass index is 37.14 kg/(m^2).   General: The patient is well-developed and well-nourished and in no acute distress  Eyes:  Funduscopic exam shows normal optic discs and retinal vessels.  Neck: The neck is supple, no carotid bruits are noted.  The neck is nontender.  Cardiovascular: The heart has a regular rate and rhythm with a normal S1 and S2. There were no murmurs, gallops or rubs. Lungs are clear to auscultation.  Skin: Extremities are without significant edema.  Musculoskeletal:  Back is nontender  Neurologic Exam  Mental status: The patient is alert and oriented x 3 at the time of the examination. The patient has apparent normal recent and remote memory, with an apparently normal attention span and concentration ability.   Speech is normal.  Cranial nerves: Extraocular movements are full. Pupils are equal, round, and reactive to light and accomodation.  Visual fields are full.  Facial symmetry is present. There is good facial sensation to soft touch bilaterally.Facial strength is normal.  Trapezius and sternocleidomastoid strength is normal. No dysarthria is noted.  The tongue is midline, and the patient has symmetric elevation of the soft palate. No obvious hearing deficits are noted.  Motor:  Muscle bulk is normal.   Tone is normal. Strength is  5 / 5 in all 4 extremities.   Sensory: Sensory testing is intact to pinprick, soft touch and vibration sensation in all 4 extremities.  Coordination: Cerebellar testing reveals good finger-nose-finger and heel-to-shin bilaterally.  Gait and station: Station is normal.   Gait is normal. Tandem gait is slightly wide. Romberg is negative.   Reflexes: Deep tendon reflexes  are symmetric.   Leg DTRs are brisk with spread at the knees but no ankle clonus.  .   Plantar responses are flexor.    DIAGNOSTIC DATA (LABS, IMAGING, TESTING) - I reviewed patient records, labs, notes, testing and imaging myself where available.      ASSESSMENT AND PLAN  MS (multiple sclerosis) ([place]) - Plan: CBC with Differential/Platelet,  Hepatic function panel, MR Brain W Wo Contrast  Gait disorder - Plan: MR Brain W Wo Contrast  Hypersomnia  Depression  History of optic neuritis  Disturbed cognition  Insomnia  Attention deficit disorder     1.    We had a long discussion about therapeutic options for her MS and compliance. Although she had 2 exacerbations after starting Aubagio, both occurred more than 2 months after stopping her treatment. She is most interested in restarting Aubagio and I will sign her up and check the CBC with differential and liver function tests. A TB test was performed 2 years ago and was negative.  We also discussed the pros and cons of Lemtrada and ocrelizumab.   If she has clinical exacerbations while being compliant with Aubagio or if there are MRI changes in the future, I would recommend that she go on one of these 2 medications as they are more efficacious. 2.   MRI of the brain with and without contrast to determine the current aggressiveness of MS. If there are many enhancing lesions, we will reconsider the current choice of disease modifying therapy and possibly switch. 3.   For her hypersomnia and attention deficit and cognitive issues, I will place her back on Vyvanse 40 mg with Adderall 5 mg when necessary in the afternoons. 4.   She will start fluoxetine 40 mg daily for her mood issues 5.    Temazepam daily at bedtime for insomnia 6.   She will return to see me in 3 months if stable or call sooner if she is having new or worsening neurologic symptoms.  Thank you for asking me to see Ibtisam for a neurologic consultation. Please let me  know if I can be of further assistance with her or other patients in the future.   Genaro Bekker A. Felecia Shelling, MD, PhD [id number], [id number] AM Certified in Neurology, Clinical Neurophysiology, Sleep Medicine, Pain Medicine and Neuroimaging  [facility] [address], [place], [place] [address] [phone]

## 2015-05-18 LAB — CBC WITH DIFFERENTIAL/PLATELET
BASOS: 0 %
Basophils Absolute: 0 10*3/uL (ref 0.0–0.2)
EOS (ABSOLUTE): 0.2 10*3/uL (ref 0.0–0.4)
EOS: 4 %
HEMATOCRIT: 39.5 % (ref 34.0–46.6)
Hemoglobin: 13.3 g/dL (ref 11.1–15.9)
Immature Grans (Abs): 0 10*3/uL (ref 0.0–0.1)
Immature Granulocytes: 0 %
LYMPHS ABS: 2 10*3/uL (ref 0.7–3.1)
Lymphs: 36 %
MCH: 33.4 pg — AB (ref 26.6–33.0)
MCHC: 33.7 g/dL (ref 31.5–35.7)
MCV: 99 fL — AB (ref 79–97)
MONOS ABS: 0.7 10*3/uL (ref 0.1–0.9)
Monocytes: 13 %
Neutrophils Absolute: 2.6 10*3/uL (ref 1.4–7.0)
Neutrophils: 47 %
PLATELETS: 237 10*3/uL (ref 150–379)
RBC: 3.98 x10E6/uL (ref 3.77–5.28)
RDW: 14 % (ref 12.3–15.4)
WBC: 5.6 10*3/uL (ref 3.4–10.8)

## 2015-05-18 LAB — HEPATIC FUNCTION PANEL
ALT: 27 IU/L (ref 0–32)
AST: 22 IU/L (ref 0–40)
Albumin: 4.3 g/dL (ref 3.5–5.5)
Alkaline Phosphatase: 78 IU/L (ref 39–117)
Bilirubin Total: 0.2 mg/dL (ref 0.0–1.2)
Bilirubin, Direct: 0.08 mg/dL (ref 0.00–0.40)
Total Protein: 6.8 g/dL (ref 6.0–8.5)

## 2015-05-19 ENCOUNTER — Telehealth: Payer: Self-pay | Admitting: *Deleted

## 2015-05-19 NOTE — Telephone Encounter (Signed)
-----   Message from Britt Bottom, MD sent at 05/18/2015  4:11 PM EDT ----- Labs are fine. If not already done so, we can send in the Aubagio form.

## 2015-05-22 ENCOUNTER — Encounter: Payer: Self-pay | Admitting: *Deleted

## 2015-05-23 ENCOUNTER — Encounter: Payer: Self-pay | Admitting: *Deleted

## 2015-05-23 MED ORDER — AMPHETAMINE-DEXTROAMPHET ER 30 MG PO CP24: 30.0000 mg | ORAL_CAPSULE | Freq: Every day | ORAL | Status: DC

## 2015-05-23 NOTE — Telephone Encounter (Signed)
-----   Message from Britt Bottom, MD sent at 05/18/2015  4:11 PM EDT ----- Labs are fine. If not already done so, we can send in the Aubagio form.

## 2015-05-23 NOTE — Telephone Encounter (Signed)
I have spoken with [name] and per [name], adivsed that labwork is fine.  She verbalized understanding of same.  [name] srf faxed to [employer].  [name] sts. Vyvanse copay is over $200.  Copay for Adderall 5mg  is $60.  Per [name], ok to try Adderall XR 30mg  qam, in place of Vyvanse, to see if cost is better.  [name] will keep the rx. for Adderall 5mg  but will not fill it yet.  If she does not get enough relief with Adderall XR 30, [name] may advise she take an additional Adderall 5mg .  Adderall XR 30 mg rx. up front [facility]/fim

## 2015-05-29 ENCOUNTER — Other Ambulatory Visit: Payer: Self-pay

## 2015-06-02 ENCOUNTER — Ambulatory Visit
Admission: RE | Admit: 2015-06-02 | Discharge: 2015-06-02 | Disposition: A | Discharge status: Home / Self Care | Payer: Medicare Other | Source: Ambulatory Visit | Attending: Neurology | Admitting: Neurology

## 2015-06-02 DIAGNOSIS — R269 Unspecified abnormalities of gait and mobility: Secondary | ICD-10-CM

## 2015-06-02 DIAGNOSIS — G35 Multiple sclerosis: Secondary | ICD-10-CM | POA: Diagnosis not present

## 2015-06-02 MED ORDER — GADOBENATE DIMEGLUMINE 529 MG/ML IV SOLN
20.0000 mL | Freq: Once | INTRAVENOUS | Status: AC | PRN
  Administered 2015-06-02: 20 mL via INTRAVENOUS

## 2015-06-06 ENCOUNTER — Telehealth: Payer: Self-pay | Admitting: Neurology

## 2015-06-06 NOTE — Telephone Encounter (Signed)
Compared to the MRI from 07/20/2013, there is only one more MS plaque which is pretty good since she was off medication for part of that time. Therefore, I think Aubagio would be a reasonable choice.

## 2015-06-06 NOTE — Telephone Encounter (Signed)
Patient called to request MRI results °

## 2015-06-07 NOTE — Telephone Encounter (Signed)
I have spoken with [name] this morning, and per RAS, advised that when compared to 2015 MRI, the MRI done last week only shows one new MS plaque, which is good considering she was off of all MS therapy for part of that time.  Per RAS, Aubagio would be a reasonable choice for her.  She verbalized understanding of same/fim

## 2015-06-12 ENCOUNTER — Telehealth: Payer: Self-pay | Admitting: Neurology

## 2015-06-12 NOTE — Telephone Encounter (Signed)
error 

## 2015-07-18 ENCOUNTER — Telehealth: Payer: Self-pay | Admitting: Neurology

## 2015-07-18 DIAGNOSIS — M9903 Segmental and somatic dysfunction of lumbar region: Secondary | ICD-10-CM | POA: Diagnosis not present

## 2015-07-18 DIAGNOSIS — M9901 Segmental and somatic dysfunction of cervical region: Secondary | ICD-10-CM | POA: Diagnosis not present

## 2015-07-18 DIAGNOSIS — M5414 Radiculopathy, thoracic region: Secondary | ICD-10-CM | POA: Diagnosis not present

## 2015-07-18 DIAGNOSIS — M9902 Segmental and somatic dysfunction of thoracic region: Secondary | ICD-10-CM | POA: Diagnosis not present

## 2015-07-18 DIAGNOSIS — M5417 Radiculopathy, lumbosacral region: Secondary | ICD-10-CM | POA: Diagnosis not present

## 2015-07-18 DIAGNOSIS — M531 Cervicobrachial syndrome: Secondary | ICD-10-CM | POA: Diagnosis not present

## 2015-07-18 MED ORDER — AMPHETAMINE-DEXTROAMPHET ER 30 MG PO CP24: 30.0000 mg | ORAL_CAPSULE | Freq: Every day | ORAL | Status: DC

## 2015-07-18 NOTE — Telephone Encounter (Signed)
Rx. awaiting RAS sig/fim 

## 2015-07-18 NOTE — Telephone Encounter (Signed)
Patient requesting refill of amphetamine-dextroamphetamine (ADDERALL XR) 30 MG 24 hr capsule Pharmacy:pick up

## 2015-07-18 NOTE — Telephone Encounter (Signed)
Adderall rx. up front [facility]/fim 

## 2015-07-19 DIAGNOSIS — M9903 Segmental and somatic dysfunction of lumbar region: Secondary | ICD-10-CM | POA: Diagnosis not present

## 2015-07-19 DIAGNOSIS — M9902 Segmental and somatic dysfunction of thoracic region: Secondary | ICD-10-CM | POA: Diagnosis not present

## 2015-07-19 DIAGNOSIS — M5414 Radiculopathy, thoracic region: Secondary | ICD-10-CM | POA: Diagnosis not present

## 2015-07-19 DIAGNOSIS — M9901 Segmental and somatic dysfunction of cervical region: Secondary | ICD-10-CM | POA: Diagnosis not present

## 2015-07-19 DIAGNOSIS — M531 Cervicobrachial syndrome: Secondary | ICD-10-CM | POA: Diagnosis not present

## 2015-07-19 DIAGNOSIS — M5417 Radiculopathy, lumbosacral region: Secondary | ICD-10-CM | POA: Diagnosis not present

## 2015-07-24 DIAGNOSIS — M9902 Segmental and somatic dysfunction of thoracic region: Secondary | ICD-10-CM | POA: Diagnosis not present

## 2015-07-24 DIAGNOSIS — M9901 Segmental and somatic dysfunction of cervical region: Secondary | ICD-10-CM | POA: Diagnosis not present

## 2015-07-24 DIAGNOSIS — M531 Cervicobrachial syndrome: Secondary | ICD-10-CM | POA: Diagnosis not present

## 2015-07-24 DIAGNOSIS — M9903 Segmental and somatic dysfunction of lumbar region: Secondary | ICD-10-CM | POA: Diagnosis not present

## 2015-07-24 DIAGNOSIS — M5417 Radiculopathy, lumbosacral region: Secondary | ICD-10-CM | POA: Diagnosis not present

## 2015-07-24 DIAGNOSIS — M5414 Radiculopathy, thoracic region: Secondary | ICD-10-CM | POA: Diagnosis not present

## 2015-08-01 DIAGNOSIS — Z113 Encounter for screening for infections with a predominantly sexual mode of transmission: Secondary | ICD-10-CM | POA: Diagnosis not present

## 2015-08-01 DIAGNOSIS — N938 Other specified abnormal uterine and vaginal bleeding: Secondary | ICD-10-CM | POA: Diagnosis not present

## 2015-08-01 DIAGNOSIS — Z124 Encounter for screening for malignant neoplasm of cervix: Secondary | ICD-10-CM | POA: Diagnosis not present

## 2015-08-01 DIAGNOSIS — Z01419 Encounter for gynecological examination (general) (routine) without abnormal findings: Secondary | ICD-10-CM | POA: Diagnosis not present

## 2015-08-16 ENCOUNTER — Ambulatory Visit: Payer: Medicare Other | Admitting: Neurology

## 2015-08-23 ENCOUNTER — Ambulatory Visit: Payer: Medicare Other | Admitting: Neurology

## 2015-10-12 ENCOUNTER — Telehealth: Payer: Self-pay | Admitting: Neurology

## 2015-10-12 MED ORDER — AMPHETAMINE-DEXTROAMPHET ER 30 MG PO CP24: 30.0000 mg | ORAL_CAPSULE | Freq: Every day | ORAL | 0 refills | Status: DC

## 2015-10-12 NOTE — Telephone Encounter (Signed)
Adderall rx. up front [facility]/fim 

## 2015-10-12 NOTE — Telephone Encounter (Signed)
Patient requesting refill of amphetamine-dextroamphetamine (ADDERALL XR) 30 MG 24 hr capsule Pharmacy: pick up

## 2015-10-12 NOTE — Telephone Encounter (Signed)
Rx. awaiting RAS sig/fim 

## 2015-12-14 DIAGNOSIS — M9902 Segmental and somatic dysfunction of thoracic region: Secondary | ICD-10-CM | POA: Diagnosis not present

## 2015-12-14 DIAGNOSIS — M5414 Radiculopathy, thoracic region: Secondary | ICD-10-CM | POA: Diagnosis not present

## 2015-12-14 DIAGNOSIS — M9903 Segmental and somatic dysfunction of lumbar region: Secondary | ICD-10-CM | POA: Diagnosis not present

## 2015-12-14 DIAGNOSIS — M531 Cervicobrachial syndrome: Secondary | ICD-10-CM | POA: Diagnosis not present

## 2015-12-14 DIAGNOSIS — M5417 Radiculopathy, lumbosacral region: Secondary | ICD-10-CM | POA: Diagnosis not present

## 2015-12-14 DIAGNOSIS — M9901 Segmental and somatic dysfunction of cervical region: Secondary | ICD-10-CM | POA: Diagnosis not present

## 2015-12-15 DIAGNOSIS — M9902 Segmental and somatic dysfunction of thoracic region: Secondary | ICD-10-CM | POA: Diagnosis not present

## 2015-12-15 DIAGNOSIS — M9903 Segmental and somatic dysfunction of lumbar region: Secondary | ICD-10-CM | POA: Diagnosis not present

## 2015-12-15 DIAGNOSIS — M5414 Radiculopathy, thoracic region: Secondary | ICD-10-CM | POA: Diagnosis not present

## 2015-12-15 DIAGNOSIS — M9901 Segmental and somatic dysfunction of cervical region: Secondary | ICD-10-CM | POA: Diagnosis not present

## 2015-12-15 DIAGNOSIS — M5417 Radiculopathy, lumbosacral region: Secondary | ICD-10-CM | POA: Diagnosis not present

## 2015-12-15 DIAGNOSIS — M531 Cervicobrachial syndrome: Secondary | ICD-10-CM | POA: Diagnosis not present

## 2015-12-18 DIAGNOSIS — M531 Cervicobrachial syndrome: Secondary | ICD-10-CM | POA: Diagnosis not present

## 2015-12-18 DIAGNOSIS — M9902 Segmental and somatic dysfunction of thoracic region: Secondary | ICD-10-CM | POA: Diagnosis not present

## 2015-12-18 DIAGNOSIS — M9903 Segmental and somatic dysfunction of lumbar region: Secondary | ICD-10-CM | POA: Diagnosis not present

## 2015-12-18 DIAGNOSIS — M9901 Segmental and somatic dysfunction of cervical region: Secondary | ICD-10-CM | POA: Diagnosis not present

## 2015-12-18 DIAGNOSIS — M5417 Radiculopathy, lumbosacral region: Secondary | ICD-10-CM | POA: Diagnosis not present

## 2015-12-18 DIAGNOSIS — M5414 Radiculopathy, thoracic region: Secondary | ICD-10-CM | POA: Diagnosis not present

## 2015-12-21 DIAGNOSIS — M531 Cervicobrachial syndrome: Secondary | ICD-10-CM | POA: Diagnosis not present

## 2015-12-21 DIAGNOSIS — M9903 Segmental and somatic dysfunction of lumbar region: Secondary | ICD-10-CM | POA: Diagnosis not present

## 2015-12-21 DIAGNOSIS — M9902 Segmental and somatic dysfunction of thoracic region: Secondary | ICD-10-CM | POA: Diagnosis not present

## 2015-12-21 DIAGNOSIS — M5417 Radiculopathy, lumbosacral region: Secondary | ICD-10-CM | POA: Diagnosis not present

## 2015-12-21 DIAGNOSIS — M9901 Segmental and somatic dysfunction of cervical region: Secondary | ICD-10-CM | POA: Diagnosis not present

## 2015-12-21 DIAGNOSIS — M5414 Radiculopathy, thoracic region: Secondary | ICD-10-CM | POA: Diagnosis not present

## 2016-01-01 ENCOUNTER — Telehealth: Payer: Self-pay | Admitting: *Deleted

## 2016-01-01 DIAGNOSIS — G35 Multiple sclerosis: Secondary | ICD-10-CM | POA: Diagnosis not present

## 2016-01-01 NOTE — Telephone Encounter (Signed)
I have spoken with [name] this morning.  She sts. she is having increased right leg numbness, also now some left sided facial numbness, onset 4 days ago.  Has not been seen since April. RAS is ooo today.  W/I appt. given with RAS tomorrow at 0930.  V/O for SM 1gram IV today received from on call physician Dr. Leonie Man.  Pt. will come in now for infusion./fim

## 2016-01-02 ENCOUNTER — Encounter: Payer: Self-pay | Admitting: *Deleted

## 2016-01-02 ENCOUNTER — Encounter: Payer: Self-pay | Admitting: Neurology

## 2016-01-02 ENCOUNTER — Ambulatory Visit (INDEPENDENT_AMBULATORY_CARE_PROVIDER_SITE_OTHER): Payer: Medicare Other | Admitting: Neurology

## 2016-01-02 VITALS — BP 152/108 | HR 82 | Resp 14 | Ht 66.0 in | Wt 230.0 lb

## 2016-01-02 DIAGNOSIS — R269 Unspecified abnormalities of gait and mobility: Secondary | ICD-10-CM

## 2016-01-02 DIAGNOSIS — G4710 Hypersomnia, unspecified: Secondary | ICD-10-CM

## 2016-01-02 DIAGNOSIS — F988 Other specified behavioral and emotional disorders with onset usually occurring in childhood and adolescence: Secondary | ICD-10-CM

## 2016-01-02 DIAGNOSIS — G35 Multiple sclerosis: Secondary | ICD-10-CM | POA: Diagnosis not present

## 2016-01-02 DIAGNOSIS — R200 Anesthesia of skin: Secondary | ICD-10-CM | POA: Diagnosis not present

## 2016-01-02 MED ORDER — INDOMETHACIN 25 MG PO CAPS: ORAL_CAPSULE | ORAL | 0 refills | Status: DC

## 2016-01-02 MED ORDER — AMPHETAMINE-DEXTROAMPHET ER 30 MG PO CP24: 30.0000 mg | ORAL_CAPSULE | Freq: Every day | ORAL | 0 refills | Status: DC

## 2016-01-02 MED ORDER — AMPHETAMINE-DEXTROAMPHETAMINE 5 MG PO TABS: ORAL_TABLET | ORAL | 0 refills | Status: DC

## 2016-01-02 NOTE — Patient Instructions (Signed)
Please call to let us know what blood pressure medicine you are on. If amlodipine 2.5 mg or 5 mg, double up for the next 3 days and call your primary care provider next week if the blood pressure is still elevated if you are on the 10 mg tablet let us know and I will probably add a second medicine.

## 2016-01-02 NOTE — Progress Notes (Signed)
[facility]  PATIENT: [name] DOB: [birth year]  REFERRING DOCTOR OR PCP:  Kelton Pillar SOURCE: [patient, records from [facility], MRI images on PACS  _________________________________   HISTORICAL  CHIEF COMPLAINT:  Chief Complaint  Patient presents with  . Multiple Sclerosis    Increased right leg and left facial numbness, onset 12-29-15.  Has not taken Aubagio in about a yr. [name] 1gram IV given yesterday, repeated today/fim    HISTORY OF PRESENT ILLNESS:  [name] Is a 40 year old woman with multiple sclerosis.  Several days ago, she had the onset of right leg and left facial numbness she received 1 g of IV Solu-Medrol yesterday and a second dose today.. She has been off any disease modifying therapy x 1 year.  She never started the Aubagio that she was placed on at the last visit.   I personally reviewed the MRI of her brain from 06/02/15 and it shows multiple T2/FLAIR hyperintense foci in a pattern and configuration consistent with chronic demyelinating plaque associated with MS. paired to an MRI from 2009, there has been some progression in the number foci and also some progression in the extent of cortical atrophy. She also has a left frontal benign-appearing developmental venous anomaly.   I also reviewed an MRI of the cervical spine from 09/07/2006. There is a focus within the posterior spinal cord adjacent to C3,.z  Gait/strength/sensation: She denies any significant difficulties with her gait but balance is mildly off. There is mild left sided weakness that she notes most when climbing stairs.  . She does note some tingling in the fingertips bilaterally but not in the legs.  Vision: The episode of optic neuritis in 2015 optic. Currently, she denies any significant difficulties with visual acuity. There is no diplopia. There is no eye pain.  Bladder/bowel: She denies any difficulties with her bladder. Specifically, there  is not urgency, frequency, hesitancy. There is no constipation.  Fatigue/sleep:   She reports fatigue that is both physical and cognitive. She is also sleepy. In the past, she got some benefit with stimulants such as Adderall or Vyvanse. She has excessive daytime sleepiness.    A PSG 11/16/2010 showed no OSA. Sleep latency was delayed. MSLT showed hypersomnia with a sleep latency of 5.4 minutes and one sleep onset REM.   Another PSG March 2012 showed a reduced sleep latency of 6 minutes with 3 of 5 showing REM sleep.  The hypersomnia also improved with the stimulants. However, sometimes with the stimulants she would feel irritable or energy in the afternoons. Therefore, she would not take on a regular basis. She has insomnia at night. Once she falls asleep she will usually stay asleep but she has difficulty falling asleep on a nightly basis. In the past, she has tried Ambien and Xanax. She had a better benefit with Xanax that she did with the Ambien.      Mood:    She has had a problem with depression off and on for the past 10 years. She has been on several different anti-depressions with variable efficacy. She has recently been prescribed Prozac 20 mg but is not taking on a regular basis. She has a lot of of stress right now with health issues and family issues. She is going through a divorce. So far it has been mostly amicable. Her husband and her are still in the same house. She has a 30 and 29 year old child.       Cognitive:  She reports a lot of difficulty with cognition. There has been problems with short-term memory, attention, executive function and verbal fluency.   She focused better and carried out tasks better with stimulants in the past.  MS History:    In 2008, she presented with left-sided numbness and weakness. She also difficulty with her gait. An MRI of the brain was consistent with multiple sclerosis. She was seeing Dr. Brett Fairy at the time.   =Initially, she was placed on Copaxone after  she was diagnosed in 2008 but stopped after a year due to MRI changes.   Then, she was placed on Betaseron but she had an exacerbation and was referred to me. We started  Tysabri in 2012. She did well on Tysabri but was high titer JCV antibody positive and stopped after a year or so.  She was started on Gilenya but stopped due to shortness of breath.  She then went on Tecfidera. She had difficulty tolerating it.    She had left greater than right arm numbness 07/05/2012 and received 3 days of IV Solu-Medrol. Of note, she had stopped Tecfidera 2 months earlier because of GI issues. MRI of the cervical spine in 2011 showed a focus adjacent to C3. An MRI performed May 2014 showed 3 enhancing lesions.   She switched to Aubagio.    An MRI of the brain 07/20/2013 at [facility] was unchanged compared to a previous one from 07/10/2012.   She had an exacerbation 12/20/2013 with right optic neuritis and received several days of IV Solu-Medrol. Of note, she had run out of Aubagio a couple months earlier.        REVIEW OF SYSTEMS: Constitutional: No fevers, chills, sweats, or change in appetite.   She has fatigue, daytime sleepiness and nighttime insomnia Eyes: No visual changes, double vision, eye pain Ear, nose and throat: No hearing loss, ear pain, nasal congestion, sore throat Cardiovascular: No chest pain, palpitations Respiratory: No shortness of breath at rest or with exertion.   No wheezes GastrointestinaI: No nausea, vomiting, diarrhea, abdominal pain, fecal incontinence Genitourinary: No dysuria, urinary retention or frequency.  No nocturia. Musculoskeletal: No neck pain, back pain Integumentary: No rash, pruritus, skin lesions Neurological: as above Psychiatric: As above Endocrine: No palpitations, diaphoresis, change in appetite, change in weigh or increased thirst Hematologic/Lymphatic: No anemia, purpura, petechiae. Allergic/Immunologic: No itchy/runny eyes, nasal congestion, recent  allergic reactions, rashes  ALLERGIES: No Known Allergies  HOME MEDICATIONS:  Current Outpatient Prescriptions:  .  amphetamine-dextroamphetamine (ADDERALL XR) 30 MG 24 hr capsule, Take 1 capsule (30 mg total) by mouth daily., Disp: 30 capsule, Rfl: 0 .  amphetamine-dextroamphetamine (ADDERALL) 5 MG tablet, Take one or two pills as needed in the afternoon, Disp: 60 tablet, Rfl: 0 .  FLUoxetine (PROZAC) 40 MG capsule, Take 1 capsule (40 mg total) by mouth daily., Disp: 30 capsule, Rfl: 11 .  indomethacin (INDOCIN) 25 MG capsule, Take up to tid prn with food, Disp: 30 capsule, Rfl: 0 .  temazepam (RESTORIL) 15 MG capsule, Take 1 capsule (15 mg total) by mouth at bedtime as needed for sleep. (Patient not taking: Reported on 01/02/2016), Disp: 30 capsule, Rfl: 3 .  Teriflunomide 14 MG TABS, Take 14 mg by mouth daily., Disp: , Rfl:   PAST MEDICAL HISTORY: Past Medical History:  Diagnosis Date  . Anxiety   . Depression   . MS (multiple sclerosis) ([place])    Diagnosed in 2005  . MS (multiple sclerosis) ([place])     PAST SURGICAL  HISTORY: No past surgical history on file.  FAMILY HISTORY: Family History  Problem Relation Age of Onset  . Depression Mother     SOCIAL HISTORY:  Social History   Social History  . Marital status: Married    Spouse name: N/A  . Number of children: N/A  . Years of education: N/A   Occupational History  . Not on file.   Social History Main Topics  . Smoking status: Former Smoker    Types: Cigarettes  . Smokeless tobacco: Not on file  . Alcohol use 0.0 oz/week     Comment: occasional/fim  . Drug use: No  . Sexual activity: Not on file   Other Topics Concern  . Not on file   Social History Narrative  . No narrative on file     PHYSICAL EXAM  Vitals:   01/02/16 1221  BP: (!) 152/108  Pulse: 82  Resp: 14  Weight: 230 lb (104.3 kg)  Height: 5\' 6"  (1.676 m)    Body mass index is 37.12 kg/m.   General: The patient is well-developed  and well-nourished and in no acute distress  Neurologic Exam  Mental status: The patient is alert and oriented x 3 at the time of the examination. The patient has apparent normal recent and remote memory, with an apparently normal attention span and concentration ability.   Speech is normal.  Cranial nerves: Extraocular movements are full. There is good facial sensation to soft touch bilaterally.Facial strength is normal.  Trapezius and sternocleidomastoid strength is normal. No dysarthria is noted.  The tongue is midline, and the patient has symmetric elevation of the soft palate. No obvious hearing deficits are noted.  Motor:  Muscle bulk is normal.   Tone is normal. Strength is  5 / 5 in all 4 extremities.   Sensory: Sensory testing shows mildly reduced left arm sensation.   Leg sensation is more symmetric.    Coordination: Cerebellar testing reveals good finger-nose-finger and heel-to-shin bilaterally.  Gait and station: Station is normal.   Gait is normal. Tandem gait is wide. Romberg is negative.   Reflexes: Deep tendon reflexes are symmetric.   Leg DTRs are brisk with spread at the knees but no ankle clonus.  Marland Kitchen    DIAGNOSTIC DATA (LABS, IMAGING, TESTING) - I reviewed patient records, labs, notes, testing and imaging myself where available.      ASSESSMENT AND PLAN  MS (multiple sclerosis) ([place])  Gait disorder  Hypersomnia  Attention deficit disorder, unspecified hyperactivity presence  Numbness    1.   We will get a third day of IV Solu-Medrol 1 g tomorrow.   Aubagio 14 mg daily. 2.   Continue Adderall XR with Adderall 5 mg when necessary in the afternoons. 3.   BP is elevated. She will call us with the name of her BP medication may want her to take an additional dose for several days as the steroid may be increasing her BP. If the BP is still elevated next week,  she should see her primary care physician. 4.   Continue fluoxetine 40 mg daily for her mood issues 5.    She will return to see me in 4 months if stable or call sooner if she is having new or worsening neurologic symptoms.    Richard A. Felecia Shelling, MD, PhD [id number], [id number] PM Certified in Neurology, Clinical Neurophysiology, Sleep Medicine, Pain Medicine and Neuroimaging  [facility] [address], [place], [place] [address] [phone]

## 2016-01-03 DIAGNOSIS — G35 Multiple sclerosis: Secondary | ICD-10-CM | POA: Diagnosis not present

## 2016-03-12 ENCOUNTER — Telehealth: Payer: Self-pay | Admitting: Neurology

## 2016-03-12 MED ORDER — AMPHETAMINE-DEXTROAMPHET ER 30 MG PO CP24: 30.0000 mg | ORAL_CAPSULE | Freq: Every day | ORAL | 0 refills | Status: DC

## 2016-03-12 MED ORDER — AMPHETAMINE-DEXTROAMPHETAMINE 5 MG PO TABS: ORAL_TABLET | ORAL | 0 refills | Status: DC

## 2016-03-12 NOTE — Telephone Encounter (Signed)
Rx's awaiting RAS sig/fim 

## 2016-03-12 NOTE — Telephone Encounter (Signed)
Pt request refill for amphetamine-dextroamphetamine (ADDERALL) 5 MG tablet and amphetamine-dextroamphetamine (ADDERALL XR) 30 MG 24 hr capsule

## 2016-03-13 NOTE — Telephone Encounter (Signed)
Both Adderall rx's up front [facility]/fim

## 2016-04-09 DIAGNOSIS — D485 Neoplasm of uncertain behavior of skin: Secondary | ICD-10-CM | POA: Diagnosis not present

## 2016-04-09 DIAGNOSIS — B070 Plantar wart: Secondary | ICD-10-CM | POA: Diagnosis not present

## 2016-05-22 ENCOUNTER — Telehealth: Payer: Self-pay | Admitting: Neurology

## 2016-05-22 NOTE — Telephone Encounter (Signed)
VM full/fim 

## 2016-05-22 NOTE — Telephone Encounter (Signed)
Patient call requesting refill for Valtrex.     Pt would like to speak with RN in reference to Standard .  Please call

## 2016-05-22 NOTE — Telephone Encounter (Signed)
Pt called back for Faith, would like to know if the Valtrex can be called in to the [facility] on [address] in [place]

## 2016-05-23 MED ORDER — VALACYCLOVIR HCL 500 MG PO TABS: 500.0000 mg | ORAL_TABLET | Freq: Two times a day (BID) | ORAL | 0 refills | Status: DC

## 2016-05-23 NOTE — Addendum Note (Signed)
Addended by: France Ravens I on: 05/23/2016 10:22 AM   Modules accepted: Orders

## 2016-05-23 NOTE — Telephone Encounter (Signed)
I have spoken with pt. this morning.  She requests rx. for Valtrex 500mg  po bid prn fever blisters.  Per RAS, this is ok.  Rx. escribed to [facility] [address] per pt's request/fim

## 2016-06-05 MED ORDER — VALACYCLOVIR HCL 500 MG PO TABS
500.0000 mg | ORAL_TABLET | Freq: Two times a day (BID) | ORAL | 0 refills | Status: DC
Start: 2016-06-05 — End: 2016-06-10

## 2016-06-05 NOTE — Telephone Encounter (Signed)
I have spoken with [name] this afternoon. 1--Valtrex escribed to [facility] per her request.  She would like to know if she can have #30 at a time instead of #6, and I have advised I will check with RAS about this when he returns to the office next week.   Also, she sts. she never received a call from [facility] when Aubagio srf was sent in in Nov. 2017, would like to see about restarting this.  New srf will need to be signed--she will come in tomorrow to sign it/fim

## 2016-06-05 NOTE — Telephone Encounter (Signed)
Pt called needs refill for valtrex. She is wanting to discuss MS treatment also.  Please call to discuss

## 2016-06-05 NOTE — Addendum Note (Signed)
Addended by: France Ravens I on: 06/05/2016 04:25 PM   Modules accepted: Orders

## 2016-06-10 MED ORDER — VALACYCLOVIR HCL 500 MG PO TABS: ORAL_TABLET | ORAL | 0 refills | Status: DC

## 2016-06-10 NOTE — Telephone Encounter (Signed)
I have spoken with [name] this afternoon and per RAS, advised ok to increase quantity of Valtrex to #30, with same dosing instructions.  Rx. escribed to [facility] per her request/fim

## 2016-06-10 NOTE — Addendum Note (Signed)
Addended by: France Ravens I on: 06/10/2016 02:29 PM   Modules accepted: Orders

## 2016-06-11 ENCOUNTER — Encounter: Payer: Self-pay | Admitting: *Deleted

## 2016-08-02 DIAGNOSIS — J069 Acute upper respiratory infection, unspecified: Secondary | ICD-10-CM | POA: Diagnosis not present

## 2016-08-08 MED ORDER — VALACYCLOVIR HCL 500 MG PO TABS: ORAL_TABLET | ORAL | 0 refills | Status: AC

## 2016-08-08 NOTE — Telephone Encounter (Signed)
Pt calling back because when refill 1st called in pt said she didn't need.  Pt said she just recently went to get it and was told after 10 days they not longer hold.  Pt is asking if the refill request for valACYclovir (VALTREX) 500 MG tablet Can be called in again, she needs it now

## 2016-08-08 NOTE — Telephone Encounter (Signed)
Valtrex escribed to [facility]/fim

## 2016-08-08 NOTE — Addendum Note (Signed)
Addended by: France Ravens I on: 08/08/2016 01:45 PM   Modules accepted: Orders

## 2016-08-29 DIAGNOSIS — I10 Essential (primary) hypertension: Secondary | ICD-10-CM | POA: Diagnosis not present

## 2016-08-29 DIAGNOSIS — R51 Headache: Secondary | ICD-10-CM | POA: Diagnosis not present

## 2016-09-02 ENCOUNTER — Telehealth: Payer: Self-pay | Admitting: Neurology

## 2016-09-02 NOTE — Telephone Encounter (Signed)
Pt called the office with new onset of head pain for the past 2-3 weeks gradually worsening. She said when she sneezes, cough, bends down it is more of a sharp intense pain. Please call

## 2016-09-02 NOTE — Telephone Encounter (Signed)
I have spoken with [name] this afternoon.  For the last couple of weeks, she c/o sharp pain with coughing, sneezing.  No other sx.  Has seen pcp for same.  F/U appt. given/fim

## 2016-09-03 ENCOUNTER — Emergency Department (HOSPITAL_COMMUNITY): Payer: Medicare Other

## 2016-09-03 ENCOUNTER — Encounter (HOSPITAL_COMMUNITY): Payer: Self-pay | Admitting: Vascular Surgery

## 2016-09-03 ENCOUNTER — Emergency Department (HOSPITAL_COMMUNITY)
Admission: EM | Admit: 2016-09-03 | Discharge: 2016-09-04 | Disposition: A | Discharge status: Home / Self Care | Payer: Medicare Other | Attending: Emergency Medicine | Admitting: Emergency Medicine

## 2016-09-03 DIAGNOSIS — G35 Multiple sclerosis: Secondary | ICD-10-CM | POA: Diagnosis not present

## 2016-09-03 DIAGNOSIS — Z79899 Other long term (current) drug therapy: Secondary | ICD-10-CM | POA: Insufficient documentation

## 2016-09-03 DIAGNOSIS — R51 Headache: Secondary | ICD-10-CM | POA: Insufficient documentation

## 2016-09-03 DIAGNOSIS — F419 Anxiety disorder, unspecified: Secondary | ICD-10-CM | POA: Insufficient documentation

## 2016-09-03 DIAGNOSIS — F329 Major depressive disorder, single episode, unspecified: Secondary | ICD-10-CM | POA: Insufficient documentation

## 2016-09-03 DIAGNOSIS — Z87891 Personal history of nicotine dependence: Secondary | ICD-10-CM | POA: Diagnosis not present

## 2016-09-03 DIAGNOSIS — R519 Headache, unspecified: Secondary | ICD-10-CM

## 2016-09-03 NOTE — Telephone Encounter (Signed)
I have spoken with [name].  She sts. right sided h/a is constant, worse with cough, sneezing, onset 3 wks. ago.  I have spoken with [name].  He is out of the office for the rest of the week, but is happy to work pt. in on 09/09/16.  [name] is agreeable.  Appt. given 09/09/16 at 1330/fim

## 2016-09-03 NOTE — ED Triage Notes (Addendum)
Pt reports to the ED for eval of head pain x 2-3 weeks. States it is not really like a HA but more a pain to the top, right side of her head. Denies any head injury. The area is not tender to touch. The pain is worse with coughing and straining with  BMs. Denies any vision changes, ataxia, dysphagia, unilateral weakness, or increased numbness or tingling (has MS).

## 2016-09-03 NOTE — Telephone Encounter (Signed)
Patient has a few more questions regarding message from yesterday.

## 2016-09-04 LAB — CSF CELL COUNT WITH DIFFERENTIAL
RBC Count, CSF: 0 /mm3
RBC Count, CSF: 0 /mm3
TUBE #: 1
Tube #: 4
WBC, CSF: 2 /mm3 (ref 0–5)
WBC, CSF: 2 /mm3 (ref 0–5)

## 2016-09-04 LAB — GLUCOSE, CSF: Glucose, CSF: 54 mg/dL (ref 40–70)

## 2016-09-04 LAB — PROTEIN, CSF: TOTAL PROTEIN, CSF: 25 mg/dL (ref 15–45)

## 2016-09-04 MED ORDER — METOCLOPRAMIDE HCL 5 MG/ML IJ SOLN
10.0000 mg | Freq: Once | INTRAMUSCULAR | Status: AC
  Administered 2016-09-04: 10 mg via INTRAVENOUS
  Filled 2016-09-04: qty 2

## 2016-09-04 MED ORDER — LIDOCAINE HCL (PF) 1 % IJ SOLN
30.0000 mL | Freq: Once | INTRAMUSCULAR | Status: AC
  Administered 2016-09-04: 30 mL
  Filled 2016-09-04: qty 30

## 2016-09-04 MED ORDER — DIPHENHYDRAMINE HCL 50 MG/ML IJ SOLN
25.0000 mg | Freq: Once | INTRAMUSCULAR | Status: AC
  Administered 2016-09-04: 25 mg via INTRAVENOUS
  Filled 2016-09-04: qty 1

## 2016-09-04 MED ORDER — SODIUM CHLORIDE 0.9 % IV BOLUS (SEPSIS)
1000.0000 mL | Freq: Once | INTRAVENOUS | Status: AC
  Administered 2016-09-04: 1000 mL via INTRAVENOUS

## 2016-09-04 MED ORDER — TRAMADOL HCL 50 MG PO TABS: 50.0000 mg | ORAL_TABLET | Freq: Four times a day (QID) | ORAL | 0 refills | Status: DC | PRN

## 2016-09-04 NOTE — Discharge Instructions (Signed)
ER evaluation today did not disclose a cause for your headache. Your CT scan was normal, and analysis of your spinal fluid was normal. The opening pressure on your spinal tap was slightly elevated (23 cm), and closing pressure was normal (15 cm). Please keep your appointment with your neurologist for next week, return to emergency if symptoms are getting worse.

## 2016-09-04 NOTE — ED Provider Notes (Signed)
[facility] DEPT Provider Note   CSN: [id number] Arrival date & time: 09/03/16  1841 By signing my name below, I, [name], attest that this documentation has been prepared under the direction and in the presence of Jillian Fuel, MD. Electronically Signed: Georgette Espinoza, ED Scribe. 09/04/16. 12:46 AM.  History   Chief Complaint Chief Complaint  Patient presents with  . Headache    HPI The history is provided by the patient. No language interpreter was used.   HPI Comments: [name] is a 41 y.o. female with anxiety, depression, and MS, who presents to the Emergency Department complaining of gradually worsening, right parietal headache onset 2-3 weeks ago. Pain is dull and pressure-like in quality; pt reports it feels as if her "head is about to pop". Denies any recent injury or trauma. Pt states pain is exacerbated with sneezing, coughing, bending over, and straining with bowel movements. She has taken OTC medications with no relief to her symptoms. Pt notes she has a left-sided dental abscess that she is currently being treated for and is concerned this may be causing her symptoms. Pt denies fever, chills, blurred/double vision, nausea, new focal numbness/paresthesias, unilateral weakness, photophobia, or any other associated symptoms.   Past Medical History:  Diagnosis Date  . Anxiety   . Depression   . MS (multiple sclerosis) ([place])    Diagnosed in 2005  . MS (multiple sclerosis) [facility])     Patient Active Problem List   Diagnosis Date Noted  . Numbness 01/02/2016  . Gait disorder 05/17/2015  . Hypersomnia 05/17/2015  . Depression 05/17/2015  . History of optic neuritis 05/17/2015  . 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 Insomnia 05/17/2015  . Attention deficit disorder 05/17/2015  . Depression, major, single episode, severe ([place]) 03/04/2012  . MS (multiple sclerosis) ([place]) 01/21/2012    History reviewed. No pertinent surgical history.  OB History    No data  available       Home Medications    Prior to Admission medications   Medication Sig Start Date End Date Taking? Authorizing Provider  amphetamine-dextroamphetamine (ADDERALL XR) 30 MG 24 hr capsule Take 1 capsule (30 mg total) by mouth daily. 03/12/16   Sater, Nanine Means, MD  amphetamine-dextroamphetamine (ADDERALL) 5 MG tablet Take one or two pills as needed in the afternoon 03/12/16   Sater, Nanine Means, MD  FLUoxetine (PROZAC) 40 MG capsule Take 1 capsule (40 mg total) by mouth daily. 05/17/15   Sater, Nanine Means, MD  indomethacin (INDOCIN) 25 MG capsule Take up to tid prn with food 01/02/16   Sater, Nanine Means, MD  temazepam (RESTORIL) 15 MG capsule Take 1 capsule (15 mg total) by mouth at bedtime as needed for sleep. Patient not taking: Reported on 01/02/2016 05/17/15   Britt Bottom, MD  Teriflunomide 14 MG TABS Take 14 mg by mouth daily.    [provider]  valACYclovir (VALTREX) 500 MG tablet Take one tablet twice daily as needed for cold sores. 08/08/16   Sater, Nanine Means, MD    Family History Family History  Problem Relation Age of Onset  . Depression Mother     Social History Social History  Substance Use Topics  . Smoking status: Former Smoker    Types: Cigarettes  . Smokeless tobacco: Never Used  . Alcohol use 0.0 oz/week     Comment: occasional/fim     Allergies   Patient has no known allergies.   Review of Systems Review of Systems  Constitutional: Negative for  chills.  HENT: Positive for dental problem.   Eyes: Negative for photophobia and visual disturbance.  Gastrointestinal: Negative for nausea.  Neurological: Positive for headaches. Negative for weakness and numbness.  All other systems reviewed and are negative.  Physical Exam Updated Vital Signs BP (!) 157/109 (BP Location: Left Arm)   Pulse 80   Temp 99 F (37.2 C) (Oral)   Resp 20   Ht 5\' 7"  (1.702 m)   Wt 203 lb (92.1 kg)   SpO2 100%   BMI 31.79 kg/m   Physical Exam    Constitutional: She is oriented to person, place, and time. She appears well-developed and well-nourished.  HENT:  Head: Normocephalic and atraumatic.  Eyes: Pupils are equal, round, and reactive to light. EOM are normal.  Fundi are normal.  Neck: Normal range of motion. Neck supple. No JVD present.  Cardiovascular: Normal rate, regular rhythm and normal heart sounds.   No murmur heard. Pulmonary/Chest: Effort normal and breath sounds normal. She has no wheezes. She has no rales. She exhibits no tenderness.  Abdominal: Soft. Bowel sounds are normal. She exhibits no distension and no mass. There is no tenderness.  Musculoskeletal: Normal range of motion. She exhibits no edema.  Lymphadenopathy:    She has no cervical adenopathy.  Neurological: She is alert and oriented to person, place, and time. No cranial nerve deficit. She exhibits normal muscle tone. Coordination normal.  Skin: Skin is warm and dry. No rash noted.  Psychiatric: She has a normal mood and affect. Her behavior is normal. Judgment and thought content normal.  Nursing note and vitals reviewed.    ED Treatments / Results  DIAGNOSTIC STUDIES: Oxygen Saturation is 100% on RA, normal by my interpretation.   COORDINATION OF CARE: 12:46 AM-Discussed next steps with pt. Pt verbalized understanding and is agreeable with the plan.   Labs (all labs ordered are listed, but only abnormal results are displayed) Labs Reviewed - No data to display  Radiology Ct Head Wo Contrast  Result Date: 09/03/2016 CLINICAL DATA:  Head pain. EXAM: CT HEAD WITHOUT CONTRAST TECHNIQUE: Contiguous axial images were obtained from the base of the skull through the vertex without intravenous contrast. COMPARISON:  Brain MRI 06/02/2015 FINDINGS: Brain: Minimal generalized atrophy. Minimal periventricular white matter hypodensity likely secondary to patient's history of MS. No intracranial hemorrhage, mass effect, or midline shift. No hydrocephalus.  The basilar cisterns are patent. No evidence of territorial infarct. No extra-axial or intracranial fluid collection. Vascular: No hyperdense vessel. Skull: Normal. Negative for fracture or focal lesion. Sinuses/Orbits: Paranasal sinuses and mastoid air cells are clear. The visualized orbits are unremarkable. Other: None. IMPRESSION: No acute intracranial abnormality. Electronically Signed   By: Jeb Levering M.D.   On: 09/03/2016 19:57    Procedures .Lumbar Puncture Date/Time: 09/04/2016 5:26 AM Performed by: [name] Authorized by: Roxanne Mins, Shaquanta Harkless   Consent:    Consent obtained:  Verbal   Consent given by:  Patient   Risks discussed:  Bleeding, infection, pain and headache   Alternatives discussed:  Delayed treatment Pre-procedure details:    Procedure purpose:  Diagnostic   Preparation: Patient was prepped and draped in usual sterile fashion   Anesthesia (see MAR for exact dosages):    Anesthesia method:  Local infiltration   Local anesthetic:  Lidocaine 1% w/o epi Procedure details:    Lumbar space:  L3-L4 interspace   Needle gauge:  22   Needle type:  Spinal needle - Quincke tip   Needle length (in):  3.5   Ultrasound guidance: no     Number of attempts:  1   Opening pressure (cm H2O):  23   Closing pressure (cm H2O):  15   Fluid appearance:  Clear   Tubes of fluid:  4   Total volume (ml):  8 Post-procedure:    Puncture site:  Adhesive bandage applied and direct pressure applied   Patient tolerance of procedure:  Tolerated well, no immediate complications   (including critical care time)  Medications Ordered in ED Medications  sodium chloride 0.9 % bolus 1,000 mL (0 mLs Intravenous Stopped 09/04/16 0218)  metoCLOPramide (REGLAN) injection 10 mg (10 mg Intravenous Given 09/04/16 0118)  diphenhydrAMINE (BENADRYL) injection 25 mg (25 mg Intravenous Given 09/04/16 0118)  lidocaine (PF) (XYLOCAINE) 1 % injection 30 mL (30 mLs Other Given 09/04/16 6295)     Initial  Impression / Assessment and Plan / ED Course  I have reviewed the triage vital signs and the nursing notes.  Pertinent labs & imaging results that were available during my care of the patient were reviewed by me and considered in my medical decision making (see chart for details).  Headache and pattern that is somewhat suggestive of pseudotumor cerebri. Old records are reviewed, and she has no relevant past visits, although it is noted that she is treated for multiple sclerosis. CT had been obtained prior to my seeing her, and is unremarkable. She's given a headache cocktail of normal saline, metoclopramide, diphenhydramine with no relief of her head pain. I have explained to patient likely diagnosis and offered option of lumbar puncture in the ED, or follow-up with her neurologist that this is she has an appointment scheduled in 5 days). She has elected to have lumbar puncture done. Lumbar puncture does show elevated opening pressure of 23 cm of water. Closing pressure was 15 cm of water.  CSF analysis is normal. Unfortunately, patient's symptoms have not improved with lowering CSF pressure. She is discharged with prescription for tramadol, referred back to her neurologist for further workup.  Final Clinical Impressions(s) / ED Diagnoses   Final diagnoses:  Head pain  Headache, unspecified headache type    New Prescriptions New Prescriptions   TRAMADOL (ULTRAM) 50 MG TABLET    Take 1 tablet (50 mg total) by mouth every 6 (six) hours as needed.   I personally performed the services described in this documentation, which was scribed in my presence. The recorded information has been reviewed and is accurate.       Jillian Fuel, MD 28/41/32 [phone]

## 2016-09-07 LAB — CSF CULTURE W GRAM STAIN

## 2016-09-07 LAB — CSF CULTURE: CULTURE: NO GROWTH

## 2016-09-09 ENCOUNTER — Encounter: Payer: Self-pay | Admitting: Neurology

## 2016-09-09 ENCOUNTER — Ambulatory Visit (INDEPENDENT_AMBULATORY_CARE_PROVIDER_SITE_OTHER): Payer: Medicare Other | Admitting: Neurology

## 2016-09-09 VITALS — BP 128/94 | HR 82 | Resp 16 | Ht 67.0 in | Wt 203.0 lb

## 2016-09-09 DIAGNOSIS — F988 Other specified behavioral and emotional disorders with onset usually occurring in childhood and adolescence: Secondary | ICD-10-CM

## 2016-09-09 DIAGNOSIS — G932 Benign intracranial hypertension: Secondary | ICD-10-CM | POA: Diagnosis not present

## 2016-09-09 DIAGNOSIS — G35 Multiple sclerosis: Secondary | ICD-10-CM | POA: Diagnosis not present

## 2016-09-09 DIAGNOSIS — R51 Headache: Secondary | ICD-10-CM | POA: Diagnosis not present

## 2016-09-09 DIAGNOSIS — R269 Unspecified abnormalities of gait and mobility: Secondary | ICD-10-CM

## 2016-09-09 DIAGNOSIS — G4710 Hypersomnia, unspecified: Secondary | ICD-10-CM

## 2016-09-09 DIAGNOSIS — R519 Headache, unspecified: Secondary | ICD-10-CM

## 2016-09-09 DIAGNOSIS — G8929 Other chronic pain: Secondary | ICD-10-CM

## 2016-09-09 MED ORDER — AMPHETAMINE-DEXTROAMPHETAMINE 10 MG PO TABS: ORAL_TABLET | ORAL | 0 refills | Status: DC

## 2016-09-09 MED ORDER — INDOMETHACIN 25 MG PO CAPS: 25.0000 mg | ORAL_CAPSULE | Freq: Three times a day (TID) | ORAL | 1 refills | Status: DC | PRN

## 2016-09-09 MED ORDER — ACETAZOLAMIDE 250 MG PO TABS: 250.0000 mg | ORAL_TABLET | Freq: Two times a day (BID) | ORAL | 5 refills | Status: DC

## 2016-09-09 MED ORDER — LEFLUNOMIDE 20 MG PO TABS: 20.0000 mg | ORAL_TABLET | Freq: Every day | ORAL | 11 refills | Status: DC

## 2016-09-09 NOTE — Progress Notes (Signed)
[facility]  PATIENT: [name] DOB: 1975-06-13  REFERRING DOCTOR OR PCP:  Kelton Pillar SOURCE: [patient, records from [facility] Neurology, MRI images on PACS  _________________________________   HISTORICAL  CHIEF COMPLAINT:  Chief Complaint  Patient presents with  . Multiple Sclerosis    Sts. she stopped Aubagio 2 mos. ago due to possible lapse in insurance down the road (due to divorce)  She is here today with new c/o h/a, constant, for 3 weeks, worse with bending over, couging, sneezing/fim    HISTORY OF PRESENT ILLNESS:  [name] Is a 41 year old woman with multiple sclerosis and more recent headaches.      HA:   She has had pain in the the right vertex/forehead.  This has been a dull ache but intensifies with Valsalva, cough or sneeze.   There is milder occipital pain.   She went to the ED.   she underwent a lumbar puncture. The CSF was normal. However, opening pressure was mildly elevated at 23 cm.    Papilledema was not noted. Head CT was normal.   I reviewed the notes from the emergency room, laboratory and imaging results and the CT images on PACS.  MS:  Late last year, she started [place] but had insurance issues and stopped.    She tried to get patient assistance but grant money was not available at that time.    Right now she has Medicare with [employer] (husband) as secondary but she is going through a divorce and would not be on his policy soon.  She had a small exacerbation in  2017. MRI of her brain from 06/02/15 shows multiple T2/FLAIR hyperintense foci in a pattern and configuration consistent with chronic demyelinating plaque associated with MS.  COmpared to an MRI from 2009, there has been some progression in the number foci and also some progression in the extent of cortical atrophy. She also has a left frontal benign-appearing developmental venous anomaly.    Gait/strength/sensation: She denies any significant difficulties with her  gait but balance is mildly off. There is mild left sided weakness that she notes most when climbing stairs.  . She does note some tingling in the fingertips bilaterally but not in the legs.  Vision: She had an episode of optic neuritis in 2015.. Currently, she denies any significant difficulties with visual acuity. There is no diplopia. There is no eye pain.  Bladder/bowel: She denies any difficulties with her bladder. Specifically, there is not urgency, frequency, hesitancy. There is no constipation.  Fatigue/sleep:  She has physical and cognitive fatigue. She also has some daytime sleepiness.   Adderall has helped some.  She often prefers the immediate release over the extended release.    A PSG 11/16/2010 showed no OSA. Sleep latency was delayed. MSLT showed hypersomnia with a sleep latency of 5.4 minutes and one sleep onset REM.   Another PSG March 2012 showed a reduced sleep latency of 6 minutes with 3 of 5 showing REM sleep.  The hypersomnia also improved with the stimulants.   She has insomnia some night.   Temazepam has helped the most.  Mood:    She has had depression off and on. Despite going through a divorce, she does not feel her mood is worse this year..  She has a 81 and 51 year old child.       Cognitive:   She has had some difficulty with cognition noticing issues with short-term memory, attention, executive function and verbal fluency. Stimulants have helped  some.    MS History:    In 2008, she presented with left-sided numbness and weakness. She also difficulty with her gait. An MRI of the brain was consistent with multiple sclerosis. She was seeing Dr. Brett Fairy at the time.   =Initially, she was placed on Copaxone after she was diagnosed in 2008 but stopped after a year due to MRI changes.   Then, she was placed on Betaseron but she had an exacerbation and was referred to me. We started  Tysabri in 2012. She did well on Tysabri but was high titer JCV antibody positive and stopped after  a year or so.  She was started on Gilenya but stopped due to shortness of breath.  She then went on Tecfidera. She had difficulty tolerating it.    She had left greater than right arm numbness 07/05/2012 and received 3 days of IV Solu-Medrol. Of note, she had stopped Tecfidera 2 months earlier because of GI issues. MRI of the cervical spine in 2011 showed a focus adjacent to C3. An MRI performed May 2014 showed 3 enhancing lesions.   She switched to Aubagio.    An MRI of the brain 07/20/2013 at [facility] was unchanged compared to a previous one from 07/10/2012.   She had an exacerbation 12/20/2013 with right optic neuritis and received several days of IV Solu-Medrol. Of note, she had run out of Aubagio a couple months earlier.        REVIEW OF SYSTEMS: Constitutional: No fevers, chills, sweats, or change in appetite.   She has fatigue, daytime sleepiness and nighttime insomnia Eyes: No visual changes, double vision, eye pain Ear, nose and throat: No hearing loss, ear pain, nasal congestion, sore throat Cardiovascular: No chest pain, palpitations Respiratory: No shortness of breath at rest or with exertion.   No wheezes GastrointestinaI: No nausea, vomiting, diarrhea, abdominal pain, fecal incontinence Genitourinary: No dysuria, urinary retention or frequency.  No nocturia. Musculoskeletal: No neck pain, back pain Integumentary: No rash, pruritus, skin lesions Neurological: as above Psychiatric: As above Endocrine: No palpitations, diaphoresis, change in appetite, change in weigh or increased thirst Hematologic/Lymphatic: No anemia, purpura, petechiae. Allergic/Immunologic: No itchy/runny eyes, nasal congestion, recent allergic reactions, rashes  ALLERGIES: No Known Allergies  HOME MEDICATIONS:  Current Outpatient Prescriptions:  .  amLODipine (NORVASC) 5 MG tablet, Take 5 mg by mouth daily., Disp: , Rfl:  .  temazepam (RESTORIL) 15 MG capsule, Take 1 capsule (15 mg total) by mouth  at bedtime as needed for sleep., Disp: 30 capsule, Rfl: 3 .  traMADol (ULTRAM) 50 MG tablet, Take 1 tablet (50 mg total) by mouth every 6 (six) hours as needed., Disp: 15 tablet, Rfl: 0 .  valACYclovir (VALTREX) 500 MG tablet, Take one tablet twice daily as needed for cold sores., Disp: 30 tablet, Rfl: 0 .  acetaZOLAMIDE (DIAMOX) 250 MG tablet, Take 1 tablet (250 mg total) by mouth 2 (two) times daily., Disp: 60 tablet, Rfl: 5 .  amphetamine-dextroamphetamine (ADDERALL) 10 MG tablet, Take up to 3 times a day, Disp: 90 tablet, Rfl: 0 .  indomethacin (INDOCIN) 25 MG capsule, Take 1 capsule (25 mg total) by mouth 3 (three) times daily as needed., Disp: 60 capsule, Rfl: 1 .  leflunomide (ARAVA) 20 MG tablet, Take 1 tablet (20 mg total) by mouth daily., Disp: 30 tablet, Rfl: 11  PAST MEDICAL HISTORY: Past Medical History:  Diagnosis Date  . Anxiety   . Depression   . MS (multiple sclerosis) ([place])    Diagnosed  in 2005  . MS (multiple sclerosis) ([place])     PAST SURGICAL HISTORY: No past surgical history on file.  FAMILY HISTORY: Family History  Problem Relation Age of Onset  . Depression Mother     SOCIAL HISTORY:  Social History   Social History  . Marital status: Divorced    Spouse name: N/A  . Number of children: N/A  . Years of education: N/A   Occupational History  . Not on file.   Social History Main Topics  . Smoking status: Former Smoker    Types: Cigarettes  . Smokeless tobacco: Never Used  . Alcohol use 0.0 oz/week     Comment: occasional/fim  . Drug use: No  . Sexual activity: Not on file   Other Topics Concern  . Not on file   Social History Narrative  . No narrative on file     PHYSICAL EXAM  Vitals:   09/09/16 1342  BP: (!) 128/94  Pulse: 82  Resp: 16  Weight: 203 lb (92.1 kg)  Height: 5\' 7"  (1.702 m)    Body mass index is 31.79 kg/m.   General: The patient is well-developed and well-nourished and in no acute distress.    The neck and the  occiput are nontender.  Neurologic Exam  Mental status: The patient is alert and oriented x 3 at the time of the examination. The patient has apparent normal recent and remote memory, with an apparently normal attention span and concentration ability.   Speech is normal.  Cranial nerves: Extraocular movements are full. Facial strength and sensation is normal.  The tongue is midline, and the patient has symmetric elevation of the soft palate. No obvious hearing deficits are noted.  Motor:  Muscle bulk is normal.   Tone is normal. Strength is  5 / 5 in all 4 extremities.   Sensory: Sensory testing shows Intact sensation to touch and vibration in the arms or legs..    Coordination: Cerebellar testing reveals good finger-nose-finger and heel-to-shin bilaterally.  Gait and station: Station is normal.   The gait is normal. Tandem gait is mildly wide. The Romberg is negative.  Reflexes: Deep tendon reflexes are symmetric.   Leg DTRs are brisk with spread at the knees but no ankle clonus.  Marland Kitchen    DIAGNOSTIC DATA (LABS, IMAGING, TESTING) - I reviewed patient records, labs, notes, testing and imaging myself where available.      ASSESSMENT AND PLAN  MS (multiple sclerosis) ([place]) - Plan: MR BRAIN W WO CONTRAST  Chronic intractable headache, unspecified headache type - Plan: MR BRAIN W WO CONTRAST  Elevated intracranial pressure - Plan: MR BRAIN W WO CONTRAST  Hypersomnia  Gait disorder  Attention deficit disorder, unspecified hyperactivity presence    1.  Etiology of the headache is unclear. A lumbar puncture showed an opening pressure that was mildly elevated at 230 mm. However, she does not have papilledema. Therefore, we need to also consider other etiologies. I will have her get an MRI of the brain with and without contrast to determine if there is any evidence of elevated intracranial pressure (empty sella, widened optic nerve sheaths) or any evidence of inflammation or mass. This  will also allow [place] to determine if her MS is showing any subclinical progression as well.   I will have her try indomethacin and Diamox to help with the pain. 2.   I will change the Adderall XR 30 mg to immediate release 10 mg.  3.   We  are having difficulty getting her Aubagio. She has not been able to get assistance from one of the charities. Therefore, I will have her start leflunomide as it is very similar to Aubagio. She will try to get back on to Aubagio later this year.  4.   Continue other med's 5.   She will return to see me in 4 months if stable or call sooner if she is having new or worsening neurologic symptoms.    Darlys Buis A. Felecia Shelling, MD, PhD 0/60/0459, 9:77 PM Certified in Neurology, Clinical Neurophysiology, Sleep Medicine, Pain Medicine and Neuroimaging  [facility] [address] [place], [place] [address] [phone]

## 2016-09-15 ENCOUNTER — Ambulatory Visit
Admission: RE | Admit: 2016-09-15 | Discharge: 2016-09-15 | Disposition: A | Discharge status: Home / Self Care | Payer: Medicare Other | Source: Ambulatory Visit | Attending: Neurology | Admitting: Neurology

## 2016-09-15 DIAGNOSIS — G35 Multiple sclerosis: Secondary | ICD-10-CM | POA: Diagnosis not present

## 2016-09-15 DIAGNOSIS — R51 Headache: Secondary | ICD-10-CM

## 2016-09-15 DIAGNOSIS — G35D Multiple sclerosis, unspecified: Secondary | ICD-10-CM

## 2016-09-15 DIAGNOSIS — G932 Benign intracranial hypertension: Secondary | ICD-10-CM

## 2016-09-15 DIAGNOSIS — R519 Headache, unspecified: Secondary | ICD-10-CM

## 2016-09-15 MED ORDER — GADOBENATE DIMEGLUMINE 529 MG/ML IV SOLN
20.0000 mL | Freq: Once | INTRAVENOUS | Status: AC | PRN
  Administered 2016-09-15: 20 mL via INTRAVENOUS

## 2016-09-16 ENCOUNTER — Ambulatory Visit: Payer: Self-pay | Admitting: Neurology

## 2016-09-16 ENCOUNTER — Telehealth: Payer: Self-pay | Admitting: Neurology

## 2016-09-16 NOTE — Telephone Encounter (Signed)
-----   Message from Britt Bottom, MD sent at 09/16/2016  4:25 PM EDT ----- Please let her know that the MRI of the brain does not show any new findings.

## 2016-09-16 NOTE — Telephone Encounter (Signed)
Called and explained to pt that the MRI was negative for any new findings. Pt was concerned because the MRI I was being done to look for other things and not just following up on MS. Pt requesting to speak with Dr Felecia Shelling regarding the MRI. She has some questions and really wanted to talk with MD. I explained I would ask the doctor but that he may recommend setting up an apt to discuss. Pt verbalized understanding

## 2016-09-16 NOTE — Telephone Encounter (Signed)
I spoke to [facility] about the MRI.   There was not any evidence of elevated intracranial pressure nor any evidence of vasculitis or tumor. The MS was stable.  She has not yet tried the indomethacin and will pick that up at the drugstore. She does not have to pick up the Diamox as the likelihood that this represents pseudotumor cerebri is very low given the borderline CSF pressure and no evidence of papilledema or changes in the pituitary gland on MRI.

## 2016-09-16 NOTE — Telephone Encounter (Signed)
Patient calling to get MRI results. °

## 2016-11-05 DIAGNOSIS — Z1231 Encounter for screening mammogram for malignant neoplasm of breast: Secondary | ICD-10-CM | POA: Diagnosis not present

## 2016-11-05 DIAGNOSIS — Z779 Other contact with and (suspected) exposures hazardous to health: Secondary | ICD-10-CM | POA: Diagnosis not present

## 2016-11-05 DIAGNOSIS — Z1159 Encounter for screening for other viral diseases: Secondary | ICD-10-CM | POA: Diagnosis not present

## 2016-11-05 DIAGNOSIS — Z113 Encounter for screening for infections with a predominantly sexual mode of transmission: Secondary | ICD-10-CM | POA: Diagnosis not present

## 2016-11-05 DIAGNOSIS — Z01419 Encounter for gynecological examination (general) (routine) without abnormal findings: Secondary | ICD-10-CM | POA: Diagnosis not present

## 2016-11-05 DIAGNOSIS — Z114 Encounter for screening for human immunodeficiency virus [HIV]: Secondary | ICD-10-CM | POA: Diagnosis not present

## 2017-01-10 ENCOUNTER — Encounter: Payer: Self-pay | Admitting: Neurology

## 2017-01-10 ENCOUNTER — Other Ambulatory Visit: Payer: Self-pay

## 2017-01-10 ENCOUNTER — Ambulatory Visit (INDEPENDENT_AMBULATORY_CARE_PROVIDER_SITE_OTHER): Payer: Medicare Other | Admitting: Neurology

## 2017-01-10 VITALS — BP 141/89 | HR 101 | Resp 16 | Wt 209.5 lb

## 2017-01-10 DIAGNOSIS — F988 Other specified behavioral and emotional disorders with onset usually occurring in childhood and adolescence: Secondary | ICD-10-CM | POA: Diagnosis not present

## 2017-01-10 DIAGNOSIS — M542 Cervicalgia: Secondary | ICD-10-CM | POA: Insufficient documentation

## 2017-01-10 DIAGNOSIS — R269 Unspecified abnormalities of gait and mobility: Secondary | ICD-10-CM | POA: Diagnosis not present

## 2017-01-10 DIAGNOSIS — R51 Headache: Secondary | ICD-10-CM | POA: Diagnosis not present

## 2017-01-10 DIAGNOSIS — G35 Multiple sclerosis: Secondary | ICD-10-CM | POA: Diagnosis not present

## 2017-01-10 DIAGNOSIS — G8929 Other chronic pain: Secondary | ICD-10-CM

## 2017-01-10 DIAGNOSIS — R519 Headache, unspecified: Secondary | ICD-10-CM

## 2017-01-10 MED ORDER — AMPHETAMINE-DEXTROAMPHETAMINE 10 MG PO TABS: ORAL_TABLET | ORAL | 0 refills | Status: DC

## 2017-01-10 MED ORDER — LEFLUNOMIDE 20 MG PO TABS: 20.0000 mg | ORAL_TABLET | Freq: Every day | ORAL | 11 refills | Status: DC

## 2017-01-10 NOTE — Progress Notes (Signed)
[facility]  PATIENT: [name] DOB: September 25, 1975  REFERRING DOCTOR OR PCP:  Kelton Pillar SOURCE: [patient, records from [facility], MRI images on PACS  _________________________________   HISTORICAL  CHIEF COMPLAINT:  Chief Complaint  Patient presents with  . Multiple Sclerosis    Sts. she continues to tolerate Arava well.  Sts. h/a is about the same. Stopped Diamox and Indomethacine; didn't feel they helped.  C/O pain/spasms right shoulder/upper back/fim  . Headache    HISTORY OF PRESENT ILLNESS:  [name] Is a 41 year old woman with multiple sclerosis and more recent headaches.      Update 01/10/2017:    She feels her MS is mostly stable.  She was unable to get good patient assistance for Aubagio and co-pays were extremely high at $2000 a month. She switched to leflunomide. She still pays $200 a month and we found places where she can get the medication for less.   She tolerates it well. The gait is baseline with mild reduced balance and some left-sided weakness when she climbs stairs. She does not note any significant numbness currently.    Vision is baseline. She did have optic neuritis in 2015 but improved. Bladder function is doing well.   She continues to note fatigue that is helped by Adderall. She does better on the immediate release than the time release. She is sleeping better and only has to take temazepam now and then. She has some depression and anxiety but mood is generally doing well.   She gets stabbing pain in her head when she coughs or sneeze.  There is no pain now in between these type of maneuvers.  She also has some tenderness at the right occiput.   When she went to the emergency room earlier this year she had a lumbar puncture and opening pressure was mildly elevated.  Acetazolamide did not help.   There was no MRI evidence of elevated intracranial pressure.  From 09/09/2016: HA:   She has had pain in the the  right vertex/forehead.  This has been a dull ache but intensifies with Valsalva, cough or sneeze.   There is milder occipital pain.   She went to the ED.   she underwent a lumbar puncture. The CSF was normal. However, opening pressure was mildly elevated at 23 cm.    Papilledema was not noted. Head CT was normal.   I reviewed the notes from the emergency room, laboratory and imaging results and the CT images on PACS.  MS:  Late last year, she started [place] but had insurance issues and stopped.    She tried to get patient assistance but grant money was not available at that time.    Right now she has Medicare with [employer] (husband) as secondary but she is going through a divorce and would not be on his policy soon.  She had a small exacerbation in  2017. MRI of her brain from 06/02/15 shows multiple T2/FLAIR hyperintense foci in a pattern and configuration consistent with chronic demyelinating plaque associated with MS.  COmpared to an MRI from 2009, there has been some progression in the number foci and also some progression in the extent of cortical atrophy. She also has a left frontal benign-appearing developmental venous anomaly.    Gait/strength/sensation: She denies any significant difficulties with her gait but balance is mildly off. There is mild left sided weakness that she notes most when climbing stairs.  . She does note some tingling in the fingertips  bilaterally but not in the legs.  Vision: She had an episode of optic neuritis in 2015.. Currently, she denies any significant difficulties with visual acuity. There is no diplopia. There is no eye pain.  Bladder/bowel: She denies any difficulties with her bladder. Specifically, there is not urgency, frequency, hesitancy. There is no constipation.  Fatigue/sleep:  She has physical and cognitive fatigue. She also has some daytime sleepiness.   Adderall has helped some.  She often prefers the immediate release over the extended release.    A PSG  11/16/2010 showed no OSA. Sleep latency was delayed. MSLT showed hypersomnia with a sleep latency of 5.4 minutes and one sleep onset REM.   Another PSG March 2012 showed a reduced sleep latency of 6 minutes with 3 of 5 showing REM sleep.  The hypersomnia also improved with the stimulants.   She has insomnia some night.   Temazepam has helped the most.  Mood:    She has had depression off and on. Despite going through a divorce, she does not feel her mood is worse this year..  She has a 28 and 52 year old child.       Cognitive:   She has had some difficulty with cognition noticing issues with short-term memory, attention, executive function and verbal fluency. Stimulants have helped some.    MS History:    In 2008, she presented with left-sided numbness and weakness. She also difficulty with her gait. An MRI of the brain was consistent with multiple sclerosis. She was seeing Dr. Brett Fairy at the time.   =Initially, she was placed on Copaxone after she was diagnosed in 2008 but stopped after a year due to MRI changes.   Then, she was placed on Betaseron but she had an exacerbation and was referred to me. We started  Tysabri in 2012. She did well on Tysabri but was high titer JCV antibody positive and stopped after a year or so.  She was started on Gilenya but stopped due to shortness of breath.  She then went on Tecfidera. She had difficulty tolerating it.    She had left greater than right arm numbness 07/05/2012 and received 3 days of IV Solu-Medrol. Of note, she had stopped Tecfidera 2 months earlier because of GI issues. MRI of the cervical spine in 2011 showed a focus adjacent to C3. An MRI performed May 2014 showed 3 enhancing lesions.   She switched to Aubagio.    An MRI of the brain 07/20/2013 at [place] was unchanged compared to a previous one from 07/10/2012.   She had an exacerbation 12/20/2013 with right optic neuritis and received several days of IV Solu-Medrol. Of note, she had run out of  Aubagio a couple months earlier.        REVIEW OF SYSTEMS: Constitutional: No fevers, chills, sweats, or change in appetite.   She has fatigue, daytime sleepiness and nighttime insomnia Eyes: No visual changes, double vision, eye pain Ear, nose and throat: No hearing loss, ear pain, nasal congestion, sore throat Cardiovascular: No chest pain, palpitations Respiratory: No shortness of breath at rest or with exertion.   No wheezes GastrointestinaI: No nausea, vomiting, diarrhea, abdominal pain, fecal incontinence Genitourinary: No dysuria, urinary retention or frequency.  No nocturia. Musculoskeletal: No neck pain, back pain Integumentary: No rash, pruritus, skin lesions Neurological: as above Psychiatric: As above Endocrine: No palpitations, diaphoresis, change in appetite, change in weigh or increased thirst Hematologic/Lymphatic: No anemia, purpura, petechiae. Allergic/Immunologic: No itchy/runny eyes, nasal congestion, recent allergic reactions,  rashes  ALLERGIES: No Known Allergies  HOME MEDICATIONS:  Current Outpatient Medications:  .  ALPRAZolam (XANAX) 1 MG tablet, Take 1 mg by mouth at bedtime as needed for anxiety., Disp: , Rfl:  .  amLODipine (NORVASC) 5 MG tablet, Take 5 mg by mouth daily., Disp: , Rfl:  .  amphetamine-dextroamphetamine (ADDERALL) 10 MG tablet, Take up to 3 times a day, Disp: 90 tablet, Rfl: 0 .  temazepam (RESTORIL) 15 MG capsule, Take 1 capsule (15 mg total) by mouth at bedtime as needed for sleep., Disp: 30 capsule, Rfl: 3 .  traMADol (ULTRAM) 50 MG tablet, Take 1 tablet (50 mg total) by mouth every 6 (six) hours as needed., Disp: 15 tablet, Rfl: 0 .  valACYclovir (VALTREX) 500 MG tablet, Take one tablet twice daily as needed for cold sores., Disp: 30 tablet, Rfl: 0 .  leflunomide (ARAVA) 20 MG tablet, Take 1 tablet (20 mg total) by mouth daily., Disp: 30 tablet, Rfl: 11  PAST MEDICAL HISTORY: Past Medical History:  Diagnosis Date  . Anxiety     . Depression   . MS (multiple sclerosis) ([place])    Diagnosed in 2005  . MS (multiple sclerosis) ([place])     PAST SURGICAL HISTORY: History reviewed. No pertinent surgical history.  FAMILY HISTORY: Family History  Problem Relation Age of Onset  . Depression Mother     SOCIAL HISTORY:  Social History   Socioeconomic History  . Marital status: Divorced    Spouse name: Not on file  . Number of children: Not on file  . Years of education: Not on file  . Highest education level: Not on file  Social Needs  . Financial resource strain: Not on file  . Food insecurity - worry: Not on file  . Food insecurity - inability: Not on file  . Transportation needs - medical: Not on file  . Transportation needs - non-medical: Not on file  Occupational History  . Not on file  Tobacco Use  . Smoking status: Former Smoker    Types: Cigarettes  . Smokeless tobacco: Never Used  Substance and Sexual Activity  . Alcohol use: Yes    Alcohol/week: 0.0 oz    Comment: occasional/fim  . Drug use: No  . Sexual activity: Not on file  Other Topics Concern  . Not on file  Social History Narrative  . Not on file     PHYSICAL EXAM  Vitals:   01/10/17 0939  BP: (!) 141/89  Pulse: (!) 101  Resp: 16  Weight: 209 lb 8 oz (95 kg)    Body mass index is 32.81 kg/m.   General: The patient is well-developed and well-nourished and in no acute distress.    The right neck and occiput are tender.   Funduscopic examination shows normal optic discs and retinal vessels. There was no evidence of papilledema and she had venous pulsations.  Neurologic Exam  Mental status: The patient is alert and oriented x 3 at the time of the examination. The patient has apparent normal recent and remote memory, with an apparently normal attention span and concentration ability.   Speech is normal.  Cranial nerves: Extraocular movements are full. Facial strength and sensation is normal.  The tongue is midline, and the  patient has symmetric elevation of the soft palate. No obvious hearing deficits are noted.  Motor:  Muscle bulk is normal.   Tone is normal. Strength is  5 / 5 in all 4 extremities.   Sensory: Sensory testing  shows intact touch and vibration in the arms and legs.   Coordination: Cerebellar testing reveals good finger-nose-finger and heel-to-shin bilaterally.  Gait and station: Station is normal.   The gait is normal. Tandem gait is mildly wide. The Romberg is negative.  Reflexes: Deep tendon reflexes are symmetric.   Leg DTRs are brisk with spread at the knees but no ankle clonus.  Marland Kitchen    DIAGNOSTIC DATA (LABS, IMAGING, TESTING) - I reviewed patient records, labs, notes, testing and imaging myself where available.      ASSESSMENT AND PLAN  MS (multiple sclerosis) ([place]) - Plan: Hepatic function panel, Sedimentation rate, C-reactive protein, ANA w/Reflex  Chronic intractable headache, unspecified headache type - Plan: Hepatic function panel, Sedimentation rate, C-reactive protein, ANA w/Reflex  Attention deficit disorder, unspecified hyperactivity presence  Gait disorder  Neck pain    1.   I do not think the headache is caused by elevated intracranial pressure. The opening pressure earlier this year was 230 (150-200 is normal).   The MRI did not show any evidence of elevated intracranial pressure. Specifically the pituitary and sella turcica was normal. The optic nerve sheaths were normal.    I will check some vasculitis labs today along with a labs for leflunomide. 2.   Continue Adderall immediate release 10 mg up to 3 daily.  3.   She was unable to get patient assistance for Aubagio co-pay (2000/month). Therefore I switched her to leflunomide. She is tolerating that well. She is still paying $200. On GoodRx.com I found places where she can get the medication was $65-$73 monthly 4.   Right splenius capitis trigger point injection with 80 mg Depo-Medrol in 3 mL Marcaine. She  tolerated the injection well. 5.   She will return to see me in 4 months if stable or call sooner if she is having new or worsening neurologic symptoms.  40 minutes face-to-face evaluation with greater than one half the time counseling and coordinating care about her MS related symptoms.      Orvetta Danielski A. Felecia Shelling, MD, PhD 99/35/7017, 79:39 AM Certified in Neurology, Clinical Neurophysiology, Sleep Medicine, Pain Medicine and Neuroimaging  [facility] [address] [place], [place] [address] [phone]

## 2017-01-11 LAB — ANA W/REFLEX: ANA: NEGATIVE

## 2017-01-11 LAB — HEPATIC FUNCTION PANEL
ALT: 33 IU/L — AB (ref 0–32)
AST: 24 IU/L (ref 0–40)
Albumin: 4.5 g/dL (ref 3.5–5.5)
Alkaline Phosphatase: 91 IU/L (ref 39–117)
Bilirubin Total: <0.2 mg/dL (ref 0.0–1.2)
Bilirubin, Direct: 0.07 mg/dL (ref 0.00–0.40)
Total Protein: 7.1 g/dL (ref 6.0–8.5)

## 2017-01-11 LAB — C-REACTIVE PROTEIN: CRP: 9.2 mg/L — AB (ref 0.0–4.9)

## 2017-01-11 LAB — SEDIMENTATION RATE: Sed Rate: 8 mm/hr (ref 0–32)

## 2017-01-14 ENCOUNTER — Telehealth: Payer: Self-pay | Admitting: *Deleted

## 2017-01-14 NOTE — Telephone Encounter (Signed)
Spoke with [name] and reviewed below lab results.  She verbalized understanding of same/fim

## 2017-01-14 NOTE — Telephone Encounter (Signed)
-----  Message from Britt Bottom, MD sent at 01/13/2017  5:42 PM EST ----- Please let the patient know that the lab work is fine.   (C- reactive peptide was mildly elevated but that does not be anything since the ESR was normal)

## 2017-01-17 DIAGNOSIS — Z30430 Encounter for insertion of intrauterine contraceptive device: Secondary | ICD-10-CM | POA: Diagnosis not present

## 2017-02-14 DIAGNOSIS — Z30431 Encounter for routine checking of intrauterine contraceptive device: Secondary | ICD-10-CM | POA: Diagnosis not present

## 2017-05-12 ENCOUNTER — Ambulatory Visit: Payer: Medicare Other | Admitting: Neurology

## 2017-05-12 ENCOUNTER — Encounter: Payer: Self-pay | Admitting: Neurology

## 2017-06-12 ENCOUNTER — Ambulatory Visit
Admission: RE | Admit: 2017-06-12 | Discharge: 2017-06-12 | Disposition: A | Discharge status: Home / Self Care | Payer: Medicare Other | Source: Ambulatory Visit | Attending: Family Medicine | Admitting: Family Medicine

## 2017-06-12 ENCOUNTER — Other Ambulatory Visit: Payer: Self-pay | Admitting: Family Medicine

## 2017-06-12 DIAGNOSIS — I10 Essential (primary) hypertension: Secondary | ICD-10-CM | POA: Diagnosis not present

## 2017-06-12 DIAGNOSIS — R200 Anesthesia of skin: Secondary | ICD-10-CM | POA: Diagnosis not present

## 2017-06-12 DIAGNOSIS — G540 Brachial plexus disorders: Secondary | ICD-10-CM | POA: Diagnosis not present

## 2017-06-13 ENCOUNTER — Other Ambulatory Visit: Payer: Self-pay | Admitting: Family Medicine

## 2017-06-13 DIAGNOSIS — G540 Brachial plexus disorders: Secondary | ICD-10-CM

## 2017-06-21 ENCOUNTER — Ambulatory Visit
Admission: RE | Admit: 2017-06-21 | Discharge: 2017-06-21 | Disposition: A | Discharge status: Home / Self Care | Payer: Self-pay | Source: Ambulatory Visit | Attending: Family Medicine | Admitting: Family Medicine

## 2017-06-21 DIAGNOSIS — G540 Brachial plexus disorders: Secondary | ICD-10-CM

## 2017-06-21 MED ORDER — GADOBENATE DIMEGLUMINE 529 MG/ML IV SOLN
20.0000 mL | Freq: Once | INTRAVENOUS | Status: AC | PRN
  Administered 2017-06-21: 20 mL via INTRAVENOUS

## 2017-06-21 NOTE — Progress Notes (Signed)
Patient ID: [name], female   DOB: December 02, 1975, 42 y.o.   MRN: [id number]  Patient called to [facility] with questions about her IV site. Patient had 20 cc of Multihance for a brachial plexus scan earlier this afternoon. Patient returned to the center for exam. The right AC IV site is slightly tender on exam. There is small ecchymosis at the IV site, measuring approximately 2.0 x 1.0 cm.  No palpable mass.   The MRI shows intravenous contrast present.  A/P: No evidence for significant contrast extravasation. Patient was reassured.

## 2017-06-30 ENCOUNTER — Other Ambulatory Visit: Payer: Self-pay

## 2017-06-30 DIAGNOSIS — R200 Anesthesia of skin: Secondary | ICD-10-CM

## 2017-07-02 ENCOUNTER — Ambulatory Visit (INDEPENDENT_AMBULATORY_CARE_PROVIDER_SITE_OTHER): Payer: PPO | Admitting: Neurology

## 2017-07-02 ENCOUNTER — Encounter: Payer: Self-pay | Admitting: Neurology

## 2017-07-02 ENCOUNTER — Other Ambulatory Visit: Payer: Self-pay

## 2017-07-02 VITALS — BP 146/90 | HR 82 | Resp 16 | Ht 67.0 in | Wt 210.0 lb

## 2017-07-02 DIAGNOSIS — F988 Other specified behavioral and emotional disorders with onset usually occurring in childhood and adolescence: Secondary | ICD-10-CM | POA: Diagnosis not present

## 2017-07-02 DIAGNOSIS — G35 Multiple sclerosis: Secondary | ICD-10-CM | POA: Diagnosis not present

## 2017-07-02 DIAGNOSIS — G8929 Other chronic pain: Secondary | ICD-10-CM

## 2017-07-02 DIAGNOSIS — F32A Depression, unspecified: Secondary | ICD-10-CM

## 2017-07-02 DIAGNOSIS — R200 Anesthesia of skin: Secondary | ICD-10-CM | POA: Diagnosis not present

## 2017-07-02 DIAGNOSIS — R51 Headache: Secondary | ICD-10-CM

## 2017-07-02 DIAGNOSIS — F329 Major depressive disorder, single episode, unspecified: Secondary | ICD-10-CM

## 2017-07-02 DIAGNOSIS — R519 Headache, unspecified: Secondary | ICD-10-CM

## 2017-07-02 DIAGNOSIS — M79602 Pain in left arm: Secondary | ICD-10-CM

## 2017-07-02 MED ORDER — OXCARBAZEPINE 300 MG PO TABS: ORAL_TABLET | ORAL | 11 refills | Status: DC

## 2017-07-02 MED ORDER — FLUOXETINE HCL 20 MG PO TABS: 20.0000 mg | ORAL_TABLET | Freq: Every day | ORAL | 3 refills | Status: DC

## 2017-07-02 MED ORDER — ARMODAFINIL 200 MG PO TABS: ORAL_TABLET | ORAL | 5 refills | Status: DC

## 2017-07-02 NOTE — Progress Notes (Signed)
[facility]  PATIENT: [name] DOB: 03/21/1975  REFERRING DOCTOR OR PCP:  Kelton Pillar SOURCE: [patient, records from [facility], MRI images on PACS  _________________________________   HISTORICAL  CHIEF COMPLAINT:  Chief Complaint  Patient presents with  . Multiple Sclerosis    Sts. she is tolerating Arava for MS well.  Sts. h/a is worse--now encompasses a larger area and has evolved from a sharp stabbing pain to more of a throbbing pain.  Nothing helps. Sts. pcp has referred her to a vascular/vein specialist for new c/o veins in left lower arm becoming enlarged, tingling, burning pain, esp. with bending elbow./fim    HISTORY OF PRESENT ILLNESS:  [name] Is a 42 year old woman with multiple sclerosis and more recent headaches.      Update 07/02/2017: She is doing well with her MS and is tolerating [place] well.    Her last MRI 09/2016 showed no new lesions.  She feels her gait is doing well.  She denies any weakness.  There are some dysesthesias.    She also has discomfort in the left arm and her veins seem to pop out a lot.    Discomfort worsens when the arm is bent.  Bladder function is fine.  She continues to note some difficulty with fatigue and sleepiness and also has some depression.  In the past she was on fluoxetine and felt her mood was doing better.  She has been on Adderall off and on but feels the left arm symptoms are worse when she takes it.  She continues to have a throbbing pain in the right vertex.  Pain is always present but will fluctuate with certain activity.    Pain radiates a few inches.     Pain is worse with sneezing, pressure, exercise and is least likely to occur at rest though it still occurs at times.   In the past a nerve block had not helped      In the past, she had a LP for the headache when she went to the ED and OP was slightly elevated at 23.        Update 01/10/2017:    She feels her MS is mostly  stable.  She was unable to get good patient assistance for Aubagio and co-pays were extremely high at $2000 a month. She switched to leflunomide. She still pays $200 a month and we found places where she can get the medication for less.   She tolerates it well. The gait is baseline with mild reduced balance and some left-sided weakness when she climbs stairs. She does not note any significant numbness currently.    Vision is baseline. She did have optic neuritis in 2015 but improved. Bladder function is doing well.   She continues to note fatigue that is helped by Adderall. She does better on the immediate release than the time release. She is sleeping better and only has to take temazepam now and then. She has some depression and anxiety but mood is generally doing well.   She gets stabbing pain in her head when she coughs or sneeze.  There is no pain now in between these type of maneuvers.  She also has some tenderness at the right occiput.   When she went to the emergency room earlier this year she had a lumbar puncture and opening pressure was mildly elevated.  Acetazolamide did not help.   There was no MRI evidence of elevated intracranial pressure.  From 09/09/2016:  HA:   She has had pain in the the right vertex/forehead.  This has been a dull ache but intensifies with Valsalva, cough or sneeze.   There is milder occipital pain.   She went to the ED.   she underwent a lumbar puncture. The CSF was normal. However, opening pressure was mildly elevated at 23 cm.    Papilledema was not noted. Head CT was normal.   I reviewed the notes from the emergency room, laboratory and imaging results and the CT images on PACS.  MS:  Late last year, she started [place] but had insurance issues and stopped.    She tried to get patient assistance but grant money was not available at that time.    Right now she has Medicare with [employer] (husband) as secondary but she is going through a divorce and would not be on his policy  soon.  She had a small exacerbation in  2017. MRI of her brain from 06/02/15 shows multiple T2/FLAIR hyperintense foci in a pattern and configuration consistent with chronic demyelinating plaque associated with MS.  COmpared to an MRI from 2009, there has been some progression in the number foci and also some progression in the extent of cortical atrophy. She also has a left frontal benign-appearing developmental venous anomaly.    Gait/strength/sensation: She denies any significant difficulties with her gait but balance is mildly off. There is mild left sided weakness that she notes most when climbing stairs.  . She does note some tingling in the fingertips bilaterally but not in the legs.  Vision: She had an episode of optic neuritis in 2015.. Currently, she denies any significant difficulties with visual acuity. There is no diplopia. There is no eye pain.  Bladder/bowel: She denies any difficulties with her bladder. Specifically, there is not urgency, frequency, hesitancy. There is no constipation.  Fatigue/sleep:  She has physical and cognitive fatigue. She also has some daytime sleepiness.   Adderall has helped some.  She often prefers the immediate release over the extended release.    A PSG 11/16/2010 showed no OSA. Sleep latency was delayed. MSLT showed hypersomnia with a sleep latency of 5.4 minutes and one sleep onset REM.   Another PSG March 2012 showed a reduced sleep latency of 6 minutes with 3 of 5 showing REM sleep.  The hypersomnia also improved with the stimulants.   She has insomnia some night.   Temazepam has helped the most.  Mood:    She has had depression off and on. Despite going through a divorce, she does not feel her mood is worse this year..  She has a 41 and 50 year old child.       Cognitive:   She has had some difficulty with cognition noticing issues with short-term memory, attention, executive function and verbal fluency. Stimulants have helped some.    MS History:    In  2008, she presented with left-sided numbness and weakness. She also difficulty with her gait. An MRI of the brain was consistent with multiple sclerosis. She was seeing Dr. Brett Fairy at the time.   =Initially, she was placed on Copaxone after she was diagnosed in 2008 but stopped after a year due to MRI changes.   Then, she was placed on Betaseron but she had an exacerbation and was referred to me. We started  Tysabri in 2012. She did well on Tysabri but was high titer JCV antibody positive and stopped after a year or so.  She was started on Gilenya  but stopped due to shortness of breath.  She then went on Tecfidera. She had difficulty tolerating it.    She had left greater than right arm numbness 07/05/2012 and received 3 days of IV Solu-Medrol. Of note, she had stopped Tecfidera 2 months earlier because of GI issues. MRI of the cervical spine in 2011 showed a focus adjacent to C3. An MRI performed May 2014 showed 3 enhancing lesions.   She switched to Aubagio.    An MRI of the brain 07/20/2013 at [place] was unchanged compared to a previous one from 07/10/2012.   She had an exacerbation 12/20/2013 with right optic neuritis and received several days of IV Solu-Medrol. Of note, she had run out of Aubagio a couple months earlier.        REVIEW OF SYSTEMS: Constitutional: No fevers, chills, sweats, or change in appetite.   She has fatigue, daytime sleepiness and nighttime insomnia Eyes: No visual changes, double vision, eye pain Ear, nose and throat: No hearing loss, ear pain, nasal congestion, sore throat Cardiovascular: No chest pain, palpitations Respiratory: No shortness of breath at rest or with exertion.   No wheezes GastrointestinaI: No nausea, vomiting, diarrhea, abdominal pain, fecal incontinence Genitourinary: No dysuria, urinary retention or frequency.  No nocturia. Musculoskeletal: No neck pain, back pain Integumentary: No rash, pruritus, skin lesions Neurological: as  above Psychiatric: As above Endocrine: No palpitations, diaphoresis, change in appetite, change in weigh or increased thirst Hematologic/Lymphatic: No anemia, purpura, petechiae. Allergic/Immunologic: No itchy/runny eyes, nasal congestion, recent allergic reactions, rashes  ALLERGIES: No Known Allergies  HOME MEDICATIONS:  Current Outpatient Medications:  .  ALPRAZolam (XANAX) 1 MG tablet, Take 1 mg by mouth at bedtime as needed for anxiety., Disp: , Rfl:  .  amLODipine (NORVASC) 5 MG tablet, Take 5 mg by mouth daily., Disp: , Rfl:  .  amphetamine-dextroamphetamine (ADDERALL) 10 MG tablet, Take up to 3 times a day, Disp: 90 tablet, Rfl: 0 .  leflunomide (ARAVA) 20 MG tablet, Take 1 tablet (20 mg total) by mouth daily., Disp: 30 tablet, Rfl: 11 .  valACYclovir (VALTREX) 500 MG tablet, Take one tablet twice daily as needed for cold sores., Disp: 30 tablet, Rfl: 0 .  Armodafinil 200 MG TABS, Take 1/2 to 1 po qAM, Disp: 30 tablet, Rfl: 5 .  FLUoxetine (PROZAC) 20 MG tablet, Take 1 tablet (20 mg total) by mouth daily., Disp: 90 tablet, Rfl: 3 .  Oxcarbazepine (TRILEPTAL) 300 MG tablet, Take one po qAM and two po qHS, Disp: 90 tablet, Rfl: 11 .  temazepam (RESTORIL) 15 MG capsule, Take 1 capsule (15 mg total) by mouth at bedtime as needed for sleep. (Patient not taking: Reported on 07/02/2017), Disp: 30 capsule, Rfl: 3 .  traMADol (ULTRAM) 50 MG tablet, Take 1 tablet (50 mg total) by mouth every 6 (six) hours as needed., Disp: 15 tablet, Rfl: 0  PAST MEDICAL HISTORY: Past Medical History:  Diagnosis Date  . Anxiety   . Depression   . MS (multiple sclerosis) ([place])    Diagnosed in 2005  . MS (multiple sclerosis) ([place])     PAST SURGICAL HISTORY: History reviewed. No pertinent surgical history.  FAMILY HISTORY: Family History  Problem Relation Age of Onset  . Depression Mother     SOCIAL HISTORY:  Social History   Socioeconomic History  . Marital status: Divorced    Spouse name:  Not on file  . Number of children: Not on file  . Years of education: Not on file  .  Highest education level: Not on file  Occupational History  . Not on file  Social Needs  . Financial resource strain: Not on file  . Food insecurity:    Worry: Not on file    Inability: Not on file  . Transportation needs:    Medical: Not on file    Non-medical: Not on file  Tobacco Use  . Smoking status: Former Smoker    Types: Cigarettes  . Smokeless tobacco: Never Used  Substance and Sexual Activity  . Alcohol use: Yes    Alcohol/week: 0.0 oz    Comment: occasional/fim  . Drug use: No  . Sexual activity: Not on file  Lifestyle  . Physical activity:    Days per week: Not on file    Minutes per session: Not on file  . Stress: Not on file  Relationships  . Social connections:    Talks on phone: Not on file    Gets together: Not on file    Attends religious service: Not on file    Active member of club or organization: Not on file    Attends meetings of clubs or organizations: Not on file    Relationship status: Not on file  . Intimate partner violence:    Fear of current or ex partner: Not on file    Emotionally abused: Not on file    Physically abused: Not on file    Forced sexual activity: Not on file  Other Topics Concern  . Not on file  Social History Narrative  . Not on file     PHYSICAL EXAM  Vitals:   07/02/17 1126  BP: (!) 146/90  Pulse: 82  Resp: 16  Weight: 210 lb (95.3 kg)  Height: 5\' 7"  (1.702 m)    Body mass index is 32.89 kg/m.   General: The patient is well-developed and well-nourished and in no acute distress.   There is no tenderness or occipital region or over the temporal arteries.  Funduscopic examination shows normal optic discs and retinal vessels. There was no evidence of papilledema and she had venous pulsations.  Neurologic Exam  Mental status: The patient is alert and oriented x 3 at the time of the examination. The patient has apparent  normal recent and remote memory, with an apparently normal attention span and concentration ability.   Speech is normal.  Cranial nerves: Extraocular movements are full.  Facial strength and sensation is normal.  Trapezius strength is normal the tongue is midline, and the patient has symmetric elevation of the soft palate. No obvious hearing deficits are noted.  Motor:  Muscle bulk is normal.   Tone is normal. Strength is  5 / 5 in all 4 extremities.   Sensory: Sensory testing shows intact touch and vibration in the arms and legs.   Coordination: Cerebellar testing reveals good finger-nose-finger.  Gait and station: Station is normal.   The gait is normal.  The tandem gait is mildly wide.. The Romberg is negative.  Reflexes: Deep tendon reflexes are symmetric.   Leg DTRs are brisk with spread at the knees but no ankle clonus.  Marland Kitchen     DIAGNOSTIC DATA (LABS, IMAGING, TESTING) - I reviewed patient records, labs, notes, testing and imaging myself where available.      ASSESSMENT AND PLAN  MS (multiple sclerosis) (HCC)  Chronic intractable headache, unspecified headache type  Numbness  Attention deficit disorder, unspecified hyperactivity presence  Depression, unspecified depression type  Left arm pain - Plan: DG Cervical  Spine 2 or 3 views    1.   For MS, she will continue leflunomide.  Later in the year we will consider an MRI to make sure that she is not having any subclinical progression that would make [place] choose a different disease modifying therapy. 2.   Since she may have had difficulty tolerating Adderall, I will have her try Nuvigil to see if this helps her fatigue and excessive daytime sleepiness.   3.   I do not know what is causing her atypical head pain.  The quality of these are somewhat neuropathic.  I do not think this is from the [place] given the focal location.  We will do a trial of oxcarbazepine.   4.   She will return to see me in 4-5 months if stable or call  sooner if she is having new or worsening neurologic symptoms.  45 minutes face-to-face evaluation with greater than one half the time counseling and coordinating care about her MS related symptoms.      Kennya Schwenn A. Felecia Shelling, MD, PhD 5/92/7639, 43:20 PM Certified in Neurology, Clinical Neurophysiology, Sleep Medicine, Pain Medicine and Neuroimaging  [facility] [address], [place], [place] [address] [phone]

## 2017-08-06 ENCOUNTER — Encounter: Payer: Self-pay | Admitting: Vascular Surgery

## 2017-08-06 ENCOUNTER — Inpatient Hospital Stay (HOSPITAL_COMMUNITY): Admission: RE | Admit: 2017-08-06 | Payer: Self-pay | Source: Ambulatory Visit

## 2017-08-11 ENCOUNTER — Telehealth (HOSPITAL_COMMUNITY): Payer: Self-pay | Admitting: Vascular Surgery

## 2017-08-11 NOTE — Telephone Encounter (Signed)
Patient no showed her appointment with Dr. Bridgett Larsson on 08/06/2017.  Referring office [facility] was notified thru flagged message on [employer].

## 2017-10-14 DIAGNOSIS — M9902 Segmental and somatic dysfunction of thoracic region: Secondary | ICD-10-CM | POA: Diagnosis not present

## 2017-10-14 DIAGNOSIS — M531 Cervicobrachial syndrome: Secondary | ICD-10-CM | POA: Diagnosis not present

## 2017-10-14 DIAGNOSIS — M9901 Segmental and somatic dysfunction of cervical region: Secondary | ICD-10-CM | POA: Diagnosis not present

## 2017-10-14 DIAGNOSIS — M5417 Radiculopathy, lumbosacral region: Secondary | ICD-10-CM | POA: Diagnosis not present

## 2017-10-14 DIAGNOSIS — M9903 Segmental and somatic dysfunction of lumbar region: Secondary | ICD-10-CM | POA: Diagnosis not present

## 2017-10-14 DIAGNOSIS — M5414 Radiculopathy, thoracic region: Secondary | ICD-10-CM | POA: Diagnosis not present

## 2017-11-04 DIAGNOSIS — L720 Epidermal cyst: Secondary | ICD-10-CM | POA: Diagnosis not present

## 2017-11-12 DIAGNOSIS — L723 Sebaceous cyst: Secondary | ICD-10-CM | POA: Diagnosis not present

## 2017-11-12 DIAGNOSIS — N9089 Other specified noninflammatory disorders of vulva and perineum: Secondary | ICD-10-CM | POA: Diagnosis not present

## 2017-11-12 DIAGNOSIS — N898 Other specified noninflammatory disorders of vagina: Secondary | ICD-10-CM | POA: Diagnosis not present

## 2017-11-24 DIAGNOSIS — Z779 Other contact with and (suspected) exposures hazardous to health: Secondary | ICD-10-CM | POA: Diagnosis not present

## 2017-11-24 DIAGNOSIS — N760 Acute vaginitis: Secondary | ICD-10-CM | POA: Diagnosis not present

## 2017-11-24 DIAGNOSIS — Z01419 Encounter for gynecological examination (general) (routine) without abnormal findings: Secondary | ICD-10-CM | POA: Diagnosis not present

## 2017-11-24 DIAGNOSIS — R768 Other specified abnormal immunological findings in serum: Secondary | ICD-10-CM | POA: Diagnosis not present

## 2017-11-24 DIAGNOSIS — Z1231 Encounter for screening mammogram for malignant neoplasm of breast: Secondary | ICD-10-CM | POA: Diagnosis not present

## 2018-01-14 ENCOUNTER — Ambulatory Visit: Payer: PPO | Admitting: Neurology

## 2018-01-29 ENCOUNTER — Telehealth: Payer: Self-pay | Admitting: Neurology

## 2018-01-29 NOTE — Telephone Encounter (Signed)
I called pt back. Advised I do not see where Dr. Felecia Shelling has prescribed this in the past. Pt stated he did back when she followed him at [facility] but she has been using her old rx and has not asked for a new rx.  Advised it is best if she requests from PCP or psychiatry to manage for anxiety/sleep. Advised Dr. Felecia Shelling does not manage these medications long term. She was last seen 06/2017 and next f/u 02/2018. Her appt in December 2019 was cx d/t Dr. Felecia Shelling being out. She will contact PCP about this. States she could not tolerate ambien in the past.

## 2018-01-29 NOTE — Telephone Encounter (Signed)
Pt has called for a refill on her ALPRAZolam (XANAX) 1 MG tablet [place] [phone]

## 2018-02-17 ENCOUNTER — Ambulatory Visit: Payer: PPO | Admitting: Neurology

## 2018-05-06 DIAGNOSIS — M5386 Other specified dorsopathies, lumbar region: Secondary | ICD-10-CM | POA: Diagnosis not present

## 2018-05-06 DIAGNOSIS — M9903 Segmental and somatic dysfunction of lumbar region: Secondary | ICD-10-CM | POA: Diagnosis not present

## 2018-05-06 DIAGNOSIS — M9902 Segmental and somatic dysfunction of thoracic region: Secondary | ICD-10-CM | POA: Diagnosis not present

## 2018-05-06 DIAGNOSIS — M9904 Segmental and somatic dysfunction of sacral region: Secondary | ICD-10-CM | POA: Diagnosis not present

## 2018-05-06 DIAGNOSIS — M5415 Radiculopathy, thoracolumbar region: Secondary | ICD-10-CM | POA: Diagnosis not present

## 2018-07-14 DIAGNOSIS — G4700 Insomnia, unspecified: Secondary | ICD-10-CM | POA: Diagnosis not present

## 2018-07-14 DIAGNOSIS — M25562 Pain in left knee: Secondary | ICD-10-CM | POA: Diagnosis not present

## 2018-07-21 ENCOUNTER — Telehealth: Payer: Self-pay | Admitting: Neurology

## 2018-07-21 NOTE — Telephone Encounter (Signed)
Tried calling pt to schedule appt but went to VM, VM full

## 2018-07-21 NOTE — Telephone Encounter (Signed)
Pt is calling in stating she is having a stabbing pain in the base of her skull on the right side and some pain in her neck, she is wanting to know if nerve block can be done.

## 2018-07-21 NOTE — Telephone Encounter (Signed)
Spoke with Dr. Felecia Shelling- ok to work in for inj/MS f/u tomorrow at 930am or 200pm

## 2018-07-21 NOTE — Telephone Encounter (Signed)
Called pt again. Scheduled work in appt for tomorrow at 930am with Dr. Felecia Shelling. She passed covid-19 screening. Asked she wear mask to appt. Advised of new check in procedure. Updated med list, pharmacy, allergies on file. She will check in about 9am.

## 2018-07-22 ENCOUNTER — Other Ambulatory Visit: Payer: Self-pay | Admitting: *Deleted

## 2018-07-22 ENCOUNTER — Ambulatory Visit (INDEPENDENT_AMBULATORY_CARE_PROVIDER_SITE_OTHER): Payer: PPO | Admitting: Neurology

## 2018-07-22 ENCOUNTER — Encounter: Payer: Self-pay | Admitting: Neurology

## 2018-07-22 ENCOUNTER — Telehealth: Payer: Self-pay | Admitting: Neurology

## 2018-07-22 ENCOUNTER — Telehealth: Payer: Self-pay | Admitting: *Deleted

## 2018-07-22 ENCOUNTER — Other Ambulatory Visit: Payer: Self-pay

## 2018-07-22 VITALS — BP 121/81 | HR 91 | Temp 97.5°F | Ht 66.0 in | Wt 212.5 lb

## 2018-07-22 DIAGNOSIS — G4711 Idiopathic hypersomnia with long sleep time: Secondary | ICD-10-CM | POA: Insufficient documentation

## 2018-07-22 DIAGNOSIS — R51 Headache: Secondary | ICD-10-CM

## 2018-07-22 DIAGNOSIS — G35 Multiple sclerosis: Secondary | ICD-10-CM

## 2018-07-22 DIAGNOSIS — M542 Cervicalgia: Secondary | ICD-10-CM | POA: Diagnosis not present

## 2018-07-22 DIAGNOSIS — E559 Vitamin D deficiency, unspecified: Secondary | ICD-10-CM | POA: Diagnosis not present

## 2018-07-22 DIAGNOSIS — M5481 Occipital neuralgia: Secondary | ICD-10-CM

## 2018-07-22 DIAGNOSIS — R5382 Chronic fatigue, unspecified: Secondary | ICD-10-CM

## 2018-07-22 DIAGNOSIS — Z8669 Personal history of other diseases of the nervous system and sense organs: Secondary | ICD-10-CM

## 2018-07-22 DIAGNOSIS — R519 Headache, unspecified: Secondary | ICD-10-CM

## 2018-07-22 MED ORDER — LEFLUNOMIDE 20 MG PO TABS: 20.0000 mg | ORAL_TABLET | Freq: Every day | ORAL | 11 refills | Status: DC

## 2018-07-22 MED ORDER — ARMODAFINIL 150 MG PO TABS: ORAL_TABLET | ORAL | 5 refills | Status: DC

## 2018-07-22 NOTE — Addendum Note (Signed)
Addended by: Arlice Colt A on: 07/22/2018 05:03 PM   Modules accepted: Orders

## 2018-07-22 NOTE — Telephone Encounter (Signed)
Received fax notification from [employer] that [place] denied. Pt must have a dx of narcolepsy, shift work disorder, OSA for it to be covered.  Fatigue associated with MS not covered dx.   Called pt. She will use goodrx coupon and get it filled at [place], [place]. It will be about 31-32 dollars for 30 days supply. Advised I will put goodrx coupon info on rx for her.   Called [facility] at [phone]. Spoke with Mendel Ryder. Cx rx armodafinil. Advised we are sending to different pharmacy. Sent request to Dr. Felecia Shelling to escribe to [facility] instead.

## 2018-07-22 NOTE — Progress Notes (Signed)
[facility]  PATIENT: [name] DOB: 14-Sep-1975  REFERRING DOCTOR OR PCP:  Kelton Pillar SOURCE: [patient, records from [facility], MRI images on PACS  _________________________________   HISTORICAL  CHIEF COMPLAINT:  Chief Complaint  Patient presents with   Follow-up    RM 13, alone. Last seen 07/02/17.    Multiple Sclerosis    Pt was on [place] for MS. She ran out of medication. She is not sure when.    Injections    Requesting nerve block for pain at base of skull/neck    HISTORY OF PRESENT ILLNESS:  [name] Is a 43 year old woman with multiple sclerosis and more recent headaches.      Update 07/22/2018: She has pain radiating from her occiput to the vertex and temporal scalp.  In the past she has similar pain (but milder) and was not sure whether she got a benefit from the occipital nerve block/splenius capitus injections (thinks mild benefit).   Pain is throbbing.   We prescribed oxcarbazepine in the past but she never took it.     She is on leflunomide for MS.  No recent exacerbations.   Gait is good.   No falls or stumbles.  Strength/sensation is doing  Bladder is doing well. Vision is fine.    Some fatigue, helped by Adderall at times.    She has hypersomnia.   Adderall and Vyvanse help but she does not like the crash that follows.    She never tried the armodafinil  Update 07/02/2017: She is doing well with her MS and is tolerating [place] well.    Her last MRI 09/2016 showed no new lesions.  She feels her gait is doing well.  She denies any weakness.  There are some dysesthesias.    She also has discomfort in the left arm and her veins seem to pop out a lot.    Discomfort worsens when the arm is bent.  Bladder function is fine.  She continues to note some difficulty with fatigue and sleepiness and also has some depression.  In the past she was on fluoxetine and felt her mood was doing better.  She has been on Adderall off and  on but feels the left arm symptoms are worse when she takes it.  She continues to have a throbbing pain in the right vertex.  Pain is always present but will fluctuate with certain activity.    Pain radiates a few inches.     Pain is worse with sneezing, pressure, exercise and is least likely to occur at rest though it still occurs at times.   In the past a nerve block had not helped      In the past, she had a LP for the headache when she went to the ED and OP was slightly elevated at 23.        Update 01/10/2017:    She feels her MS is mostly stable.  She was unable to get good patient assistance for Aubagio and co-pays were extremely high at $2000 a month. She switched to leflunomide. She still pays $200 a month and we found places where she can get the medication for less.   She tolerates it well. The gait is baseline with mild reduced balance and some left-sided weakness when she climbs stairs. She does not note any significant numbness currently.    Vision is baseline. She did have optic neuritis in 2015 but improved. Bladder function is doing well.  She continues to note fatigue that is helped by Adderall. She does better on the immediate release than the time release. She is sleeping better and only has to take temazepam now and then. She has some depression and anxiety but mood is generally doing well.   She gets stabbing pain in her head when she coughs or sneeze.  There is no pain now in between these type of maneuvers.  She also has some tenderness at the right occiput.   When she went to the emergency room earlier this year she had a lumbar puncture and opening pressure was mildly elevated.  Acetazolamide did not help.   There was no MRI evidence of elevated intracranial pressure.  From 09/09/2016: HA:   She has had pain in the the right vertex/forehead.  This has been a dull ache but intensifies with Valsalva, cough or sneeze.   There is milder occipital pain.   She went to the ED.   she  underwent a lumbar puncture. The CSF was normal. However, opening pressure was mildly elevated at 23 cm.    Papilledema was not noted. Head CT was normal.   I reviewed the notes from the emergency room, laboratory and imaging results and the CT images on PACS.  MS:  Late last year, she started [place] but had insurance issues and stopped.    She tried to get patient assistance but grant money was not available at that time.    Right now she has Medicare with [employer] (husband) as secondary but she is going through a divorce and would not be on his policy soon.  She had a small exacerbation in  2017. MRI of her brain from 06/02/15 shows multiple T2/FLAIR hyperintense foci in a pattern and configuration consistent with chronic demyelinating plaque associated with MS.  COmpared to an MRI from 2009, there has been some progression in the number foci and also some progression in the extent of cortical atrophy. She also has a left frontal benign-appearing developmental venous anomaly.    Gait/strength/sensation: She denies any significant difficulties with her gait but balance is mildly off. There is mild left sided weakness that she notes most when climbing stairs.  . She does note some tingling in the fingertips bilaterally but not in the legs.  Vision: She had an episode of optic neuritis in 2015.. Currently, she denies any significant difficulties with visual acuity. There is no diplopia. There is no eye pain.  Bladder/bowel: She denies any difficulties with her bladder. Specifically, there is not urgency, frequency, hesitancy. There is no constipation.  Fatigue/sleep:  She has physical and cognitive fatigue. She also has some daytime sleepiness.   Adderall has helped some.  She often prefers the immediate release over the extended release.    A PSG 11/16/2010 showed no OSA. Sleep latency was delayed. MSLT showed hypersomnia with a sleep latency of 5.4 minutes and one sleep onset REM.   Another PSG March 2012  showed a reduced sleep latency of 6 minutes with 3 of 5 showing REM sleep.  The hypersomnia also improved with the stimulants.   She has insomnia some night.   Temazepam has helped the most.  Mood:    She has had depression off and on. Despite going through a divorce, she does not feel her mood is worse this year..  She has a 90 and 40 year old child.       Cognitive:   She has had some difficulty with cognition noticing issues with short-term  memory, attention, executive function and verbal fluency. Stimulants have helped some.    MS History:    In 2008, she presented with left-sided numbness and weakness. She also difficulty with her gait. An MRI of the brain was consistent with multiple sclerosis. She was seeing Dr. Brett Fairy at the time.   =Initially, she was placed on Copaxone after she was diagnosed in 2008 but stopped after a year due to MRI changes.   Then, she was placed on Betaseron but she had an exacerbation and was referred to me. We started  Tysabri in 2012. She did well on Tysabri but was high titer JCV antibody positive and stopped after a year or so.  She was started on Gilenya but stopped due to shortness of breath.  She then went on Tecfidera. She had difficulty tolerating it.    She had left greater than right arm numbness 07/05/2012 and received 3 days of IV Solu-Medrol. Of note, she had stopped Tecfidera 2 months earlier because of GI issues. MRI of the cervical spine in 2011 showed a focus adjacent to C3. An MRI performed May 2014 showed 3 enhancing lesions.   She switched to Aubagio.    An MRI of the brain 07/20/2013 at [facility] was unchanged compared to a previous one from 07/10/2012.   She had an exacerbation 12/20/2013 with right optic neuritis and received several days of IV Solu-Medrol. Of note, she had run out of Aubagio a couple months earlier.     REVIEW OF SYSTEMS: Constitutional: No fevers, chills, sweats, or change in appetite.   She has fatigue, daytime sleepiness and  nighttime insomnia Eyes: No visual changes, double vision, eye pain Ear, nose and throat: No hearing loss, ear pain, nasal congestion, sore throat Cardiovascular: No chest pain, palpitations Respiratory: No shortness of breath at rest or with exertion.   No wheezes GastrointestinaI: No nausea, vomiting, diarrhea, abdominal pain, fecal incontinence Genitourinary: No dysuria, urinary retention or frequency.  No nocturia. Musculoskeletal: No neck pain, back pain Integumentary: No rash, pruritus, skin lesions Neurological: as above Psychiatric: As above Endocrine: No palpitations, diaphoresis, change in appetite, change in weigh or increased thirst Hematologic/Lymphatic: No ane mia, purpura, petechiae. Allergic/Immunologic: No itchy/runny eyes, nasal congestion, recent allergic reactions, rashes  ALLERGIES: No Known Allergies  HOME MEDICATIONS:  Current Outpatient Medications:    ALPRAZolam (XANAX) 1 MG tablet, Take 1 mg by mouth at bedtime as needed for anxiety., Disp: , Rfl:    amLODipine (NORVASC) 5 MG tablet, Take 5 mg by mouth daily., Disp: , Rfl:    valACYclovir (VALTREX) 500 MG tablet, Take one tablet twice daily as needed for cold sores., Disp: 30 tablet, Rfl: 0   amphetamine-dextroamphetamine (ADDERALL) 10 MG tablet, Take up to 3 times a day (Patient not taking: Reported on 07/21/2018), Disp: 90 tablet, Rfl: 0   Armodafinil 150 MG tablet, Take 1/2 to 1 pill po qAM, Disp: 30 tablet, Rfl: 5   leflunomide (ARAVA) 20 MG tablet, Take 1 tablet (20 mg total) by mouth daily., Disp: 30 tablet, Rfl: 11  PAST MEDICAL HISTORY: Past Medical History:  Diagnosis Date   Anxiety    Depression    MS (multiple sclerosis) ([place])    Diagnosed in 2005   MS (multiple sclerosis) ([place])     PAST SURGICAL HISTORY: History reviewed. No pertinent surgical history.  FAMILY HISTORY: Family History  Problem Relation Age of Onset   Depression Mother     SOCIAL HISTORY:  Social  History   Socioeconomic  History   Marital status: Divorced    Spouse name: Not on file   Number of children: Not on file   Years of education: Not on file   Highest education level: Not on file  Occupational History   Not on file  Social Needs   Financial resource strain: Not on file   Food insecurity:    Worry: Not on file    Inability: Not on file   Transportation needs:    Medical: Not on file    Non-medical: Not on file  Tobacco Use   Smoking status: Former Smoker    Types: Cigarettes   Smokeless tobacco: Never Used  Substance and Sexual Activity   Alcohol use: Yes    Alcohol/week: 0.0 standard drinks    Comment: occasional/fim   Drug use: No   Sexual activity: Not on file  Lifestyle   Physical activity:    Days per week: Not on file    Minutes per session: Not on file   Stress: Not on file  Relationships   Social connections:    Talks on phone: Not on file    Gets together: Not on file    Attends religious service: Not on file    Active member of club or organization: Not on file    Attends meetings of clubs or organizations: Not on file    Relationship status: Not on file   Intimate partner violence:    Fear of current or ex partner: Not on file    Emotionally abused: Not on file    Physically abused: Not on file    Forced sexual activity: Not on file  Other Topics Concern   Not on file  Social History Narrative   Not on file     PHYSICAL EXAM  Vitals:   07/22/18 0914  BP: 121/81  Pulse: 91  Temp: (!) 97.5 F (36.4 C)  Weight: 212 lb 8 oz (96.4 kg)  Height: 5\' 6"  (1.676 m)    Body mass index is 34.3 kg/m.   General: The patient is well-developed and well-nourished and in no acute distress.   She has market tenderness over the right occiput and mild tenderness on the left.  There is also moderate tenderness in the cervical paraspinal muscles further down on the right.  Range of motion is slightly reduced in the  neck.  Neurologic Exam  Mental status: The patient is alert and oriented x 3 at the time of the examination. The patient has apparent normal recent and remote memory, with an apparently normal attention span and concentration ability.   Speech is normal.  Cranial nerves: Extraocular movements are full.  Facial strength and sensation is normal.  Trapezius strength is normal the tongue is midline, and the patient has symmetric elevation of the soft palate. No obvious hearing deficits are noted.  Motor:  Muscle bulk is normal.   Tone is normal. Strength is  5 / 5 in all 4 extremities.   Sensory: Sensory testing shows intact touch and vibration in the arms and legs.   Coordination: Cerebellar testing reveals good finger-nose-finger.  Gait and station: Station is normal.   The gait is normal.  The tandem gait is mildly wide.. The Romberg is negative.  Reflexes: Deep tendon reflexes are symmetric.   Leg DTRs are brisk with spread at the knees but no ankle clonus.  Marland Kitchen     DIAGNOSTIC DATA (LABS, IMAGING, TESTING) - I reviewed patient records, labs, notes, testing and imaging  myself where available.      ASSESSMENT AND PLAN  Multiple sclerosis ([place]) - Plan: MR BRAIN W WO CONTRAST, MR CERVICAL SPINE W WO CONTRAST, Comprehensive metabolic panel, CBC with Differential/Platelet  Cervicalgia - Plan: MR CERVICAL SPINE W WO CONTRAST  History of optic neuritis  Neck pain  Occipital neuralgia of right side - Plan: Comprehensive metabolic panel, CBC with Differential/Platelet  MS (multiple sclerosis) ([facility])  Chronic intractable headache, unspecified headache type  Hypersomnia, idiopathic  Vitamin D deficiency - Plan: VITAMIN D 25 Hydroxy (Vit-D Deficiency, Fractures)    1.  Continue leflunomide as a disease modifying therapy.  It is related to Aubagio and likely is just as effective.  We will check some blood work today.  We also need to check MRI of the brain and cervical spine due to  her symptoms to determine if there has been subclinical progression.  If present, we will need to consider a switch to a different disease modifying therapy.   2.   Continue Adderall as needed (takes very sparingly).     Ok for alprazolam at night prn insomnia (takes sparingly).     3.  Splenius capitis trigger point injection on the right with 80 mg Depo-Medrol and Marcaine to hopefully help the headaches and neck pain.  She tolerated the injection well and there were no complications.  The MRI of the cervical spine will also help [place] evaluate her pain/occipital neuralgia 4.   She will return to see me in 6 months if stable or call sooner if she is having new or worsening neurologic symptoms.  45 minutes face-to-face evaluation with greater than one half the time counseling and coordinating care about her MS related symptoms.      Merwin Breden A. Felecia Shelling, MD, PhD 7/37/3668, 1:59 PM Certified in Neurology, Clinical Neurophysiology, Sleep Medicine, Pain Medicine and Neuroimaging  [facility] [address], [place], [place] [address] [phone]

## 2018-07-22 NOTE — Addendum Note (Signed)
Addended by: Hope Pigeon on: 07/22/2018 03:47 PM   Modules accepted: Orders

## 2018-07-22 NOTE — Telephone Encounter (Signed)
Health team order sent to GI. No auth they will reach out to the patient to schedule.  

## 2018-07-22 NOTE — Telephone Encounter (Signed)
PA armodafinil submitted on CMM. [id number]. Waiting on determination.

## 2018-07-23 ENCOUNTER — Telehealth: Payer: Self-pay | Admitting: *Deleted

## 2018-07-23 DIAGNOSIS — M2550 Pain in unspecified joint: Secondary | ICD-10-CM

## 2018-07-23 LAB — COMPREHENSIVE METABOLIC PANEL
ALT: 28 IU/L (ref 0–32)
AST: 22 IU/L (ref 0–40)
Albumin/Globulin Ratio: 1.6 (ref 1.2–2.2)
Albumin: 4.2 g/dL (ref 3.8–4.8)
Alkaline Phosphatase: 84 IU/L (ref 39–117)
BUN/Creatinine Ratio: 21 (ref 9–23)
BUN: 15 mg/dL (ref 6–24)
Bilirubin Total: 0.3 mg/dL (ref 0.0–1.2)
CO2: 25 mmol/L (ref 20–29)
Calcium: 10 mg/dL (ref 8.7–10.2)
Chloride: 102 mmol/L (ref 96–106)
Creatinine, Ser: 0.7 mg/dL (ref 0.57–1.00)
GFR calc Af Amer: 124 mL/min/{1.73_m2} (ref >59)
GFR calc non Af Amer: 108 mL/min/{1.73_m2} (ref >59)
Globulin, Total: 2.7 g/dL (ref 1.5–4.5)
Glucose: 82 mg/dL (ref 65–99)
Potassium: 4.1 mmol/L (ref 3.5–5.2)
Sodium: 143 mmol/L (ref 134–144)
Total Protein: 6.9 g/dL (ref 6.0–8.5)

## 2018-07-23 LAB — CBC WITH DIFFERENTIAL/PLATELET
Basophils Absolute: 0 10*3/uL (ref 0.0–0.2)
Basos: 1 %
EOS (ABSOLUTE): 0.2 10*3/uL (ref 0.0–0.4)
Eos: 2 %
Hematocrit: 41.6 % (ref 34.0–46.6)
Hemoglobin: 14.5 g/dL (ref 11.1–15.9)
Immature Grans (Abs): 0 10*3/uL (ref 0.0–0.1)
Immature Granulocytes: 0 %
Lymphocytes Absolute: 1.6 10*3/uL (ref 0.7–3.1)
Lymphs: 22 %
MCH: 34.1 pg — ABNORMAL HIGH (ref 26.6–33.0)
MCHC: 34.9 g/dL (ref 31.5–35.7)
MCV: 98 fL — ABNORMAL HIGH (ref 79–97)
Monocytes Absolute: 0.7 10*3/uL (ref 0.1–0.9)
Monocytes: 9 %
Neutrophils Absolute: 4.7 10*3/uL (ref 1.4–7.0)
Neutrophils: 66 %
Platelets: 241 10*3/uL (ref 150–450)
RBC: 4.25 x10E6/uL (ref 3.77–5.28)
RDW: 12.4 % (ref 11.7–15.4)
WBC: 7.2 10*3/uL (ref 3.4–10.8)

## 2018-07-23 LAB — VITAMIN D 25 HYDROXY (VIT D DEFICIENCY, FRACTURES): Vit D, 25-Hydroxy: 18.4 ng/mL — ABNORMAL LOW (ref 30.0–100.0)

## 2018-07-23 NOTE — Telephone Encounter (Signed)
-----   Message from Britt Bottom, MD sent at 07/23/2018 12:23 PM EDT ----- Please let the patient know that the lab work is fine.

## 2018-07-23 NOTE — Telephone Encounter (Signed)
Called pt. Relayed results per Dr. Felecia Shelling note. She verbalized understanding.   She states the injection she received yesterday was ineffective and wanting to know next steps. She is tender from inj which I advised can occur after receiving these.  She also wants to know if Dr. Felecia Shelling will order lab to check for rheumatoid arthritis. She has intermittent joint pain.

## 2018-07-24 NOTE — Telephone Encounter (Signed)
I called back to discuss headache  but could not leave VM (mail box full)  Also, vit D is low and I will discuss when I contact her.

## 2018-07-27 ENCOUNTER — Other Ambulatory Visit: Payer: Self-pay | Admitting: Neurology

## 2018-07-27 MED ORDER — VITAMIN D (ERGOCALCIFEROL) 1.25 MG (50000 UNIT) PO CAPS: 50000.0000 [IU] | ORAL_CAPSULE | ORAL | 1 refills | Status: DC

## 2018-07-27 MED ORDER — INDOMETHACIN 25 MG PO CAPS: 25.0000 mg | ORAL_CAPSULE | Freq: Three times a day (TID) | ORAL | 0 refills | Status: DC | PRN

## 2018-07-27 NOTE — Telephone Encounter (Signed)
1.   Her headache persisted over the weekend but is better today.  I will still call in indomethacin 25 mg to take up to 3 times a day as needed.  Advised not to take on a continuous basis and if headaches are not better in a few days to give [place] a call. 2.   Vitamin D was low (18) I will call in 50,000 units x 6 months weekly and then she should take 5000 units a day OTC 3.   She has had more swelling and pain in the knees.  I will write some orders for arthritic labs.

## 2018-08-03 ENCOUNTER — Encounter (HOSPITAL_COMMUNITY): Payer: Self-pay | Admitting: Emergency Medicine

## 2018-08-03 ENCOUNTER — Emergency Department (HOSPITAL_COMMUNITY)
Admission: EM | Admit: 2018-08-03 | Discharge: 2018-08-03 | Disposition: A | Discharge status: Home / Self Care | Payer: PPO | Attending: Emergency Medicine | Admitting: Emergency Medicine

## 2018-08-03 ENCOUNTER — Other Ambulatory Visit: Payer: Self-pay

## 2018-08-03 DIAGNOSIS — G35 Multiple sclerosis: Secondary | ICD-10-CM | POA: Insufficient documentation

## 2018-08-03 DIAGNOSIS — N3091 Cystitis, unspecified with hematuria: Secondary | ICD-10-CM | POA: Insufficient documentation

## 2018-08-03 DIAGNOSIS — Z87891 Personal history of nicotine dependence: Secondary | ICD-10-CM | POA: Insufficient documentation

## 2018-08-03 DIAGNOSIS — Z79899 Other long term (current) drug therapy: Secondary | ICD-10-CM | POA: Diagnosis not present

## 2018-08-03 DIAGNOSIS — R319 Hematuria, unspecified: Secondary | ICD-10-CM | POA: Diagnosis present

## 2018-08-03 LAB — URINALYSIS, ROUTINE W REFLEX MICROSCOPIC
Bilirubin Urine: NEGATIVE
Glucose, UA: 50 mg/dL — AB
Ketones, ur: NEGATIVE mg/dL
Nitrite: POSITIVE — AB
Protein, ur: 100 mg/dL — AB
RBC / HPF: >50 RBC/hpf — ABNORMAL HIGH (ref 0–5)
Specific Gravity, Urine: 1.015 (ref 1.005–1.030)
pH: 6 (ref 5.0–8.0)

## 2018-08-03 LAB — PREGNANCY, URINE: Preg Test, Ur: NEGATIVE

## 2018-08-03 MED ORDER — CEPHALEXIN 500 MG PO CAPS
500.0000 mg | ORAL_CAPSULE | Freq: Once | ORAL | Status: AC
  Administered 2018-08-03: 500 mg via ORAL
  Filled 2018-08-03: qty 1

## 2018-08-03 MED ORDER — CEPHALEXIN 500 MG PO CAPS: 500.0000 mg | ORAL_CAPSULE | Freq: Three times a day (TID) | ORAL | 0 refills | Status: AC

## 2018-08-03 NOTE — ED Triage Notes (Signed)
Pt reports having new onset of hematuria and frequency of urination.

## 2018-08-03 NOTE — Discharge Instructions (Signed)
Follow up with your doctor if symptoms do not improve over the next 24-48 hours. If you develop new or worsening symptoms, return to the emergency department for further evaluation.

## 2018-08-03 NOTE — ED Provider Notes (Signed)
[facility] DEPT Provider Note   CSN: [id number] Arrival date & time: 08/03/18  0433     History   Chief Complaint Chief Complaint  Patient presents with  . Hematuria    HPI [name] is a 43 y.o. female.     Patient with history of MS, depression, presents with hematuria that started tonight. She endorses frequency of urine, mild dysuria. No fever, back pain, nausea or abdominal pain. No history of kidney stones, or frequent UTI's.   The history is provided by the patient. No language interpreter was used.  Hematuria Pertinent negatives include no abdominal pain.    Past Medical History:  Diagnosis Date  . Anxiety   . Depression   . MS (multiple sclerosis) ([place])    Diagnosed in 2005  . MS (multiple sclerosis) [facility])     Patient Active Problem List   Diagnosis Date Noted  . Hypersomnia, idiopathic 07/22/2018  . Vitamin D deficiency 07/22/2018  . Neck pain 01/10/2017  . Headache 09/09/2016  . Elevated intracranial pressure 09/09/2016  . Numbness 01/02/2016  . Gait disorder 05/17/2015  . Hypersomnia 05/17/2015  . Depression 05/17/2015  . History of optic neuritis 05/17/2015  . 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 Insomnia 05/17/2015  . Attention deficit disorder 05/17/2015  . Depression, major, single episode, severe ([place]) 03/04/2012  . MS (multiple sclerosis) ([place]) 01/21/2012    History reviewed. No pertinent surgical history.   OB History   No obstetric history on file.      Home Medications    Prior to Admission medications   Medication Sig Start Date End Date Taking? Authorizing Provider  ALPRAZolam Duanne Moron) 1 MG tablet Take 1 mg by mouth at bedtime as needed for anxiety.    [provider]  amLODipine (NORVASC) 5 MG tablet Take 5 mg by mouth daily.    [provider]  amphetamine-dextroamphetamine (ADDERALL) 10 MG tablet Take up to 3 times a day Patient not taking: Reported on 07/21/2018 01/10/17    Britt Bottom, MD  Armodafinil 150 MG tablet Take 1/2 to 1 pill po qAM 07/22/18   Sater, Nanine Means, MD  indomethacin (INDOCIN) 25 MG capsule Take 1 capsule (25 mg total) by mouth 3 (three) times daily with meals as needed. 07/27/18   Sater, Nanine Means, MD  leflunomide (ARAVA) 20 MG tablet Take 1 tablet (20 mg total) by mouth daily. 07/22/18   Sater, Nanine Means, MD  valACYclovir (VALTREX) 500 MG tablet Take one tablet twice daily as needed for cold sores. 08/08/16   Sater, Nanine Means, MD  Vitamin D, Ergocalciferol, (DRISDOL) 1.25 MG (50000 UT) CAPS capsule Take 1 capsule (50,000 Units total) by mouth every 7 (seven) days. 07/27/18   Sater, Nanine Means, MD    Family History Family History  Problem Relation Age of Onset  . Depression Mother     Social History Social History   Tobacco Use  . Smoking status: Former Smoker    Types: Cigarettes  . Smokeless tobacco: Never Used  Substance Use Topics  . Alcohol use: Yes    Alcohol/week: 0.0 standard drinks    Comment: occasional/fim  . Drug use: No     Allergies   Patient has no known allergies.   Review of Systems Review of Systems  Constitutional: Negative for chills and fever.  Respiratory: Negative.   Cardiovascular: Negative.   Gastrointestinal: Negative.  Negative for abdominal pain and nausea.  Genitourinary: Positive for dysuria, frequency and hematuria. Negative  for flank pain.  Musculoskeletal: Negative.  Negative for back pain.  Skin: Negative.   Neurological: Negative.      Physical Exam Updated Vital Signs BP (!) 163/116 (BP Location: Left Arm)   Pulse (!) 123   Temp 99 F (37.2 C) (Oral)   Resp 20   Ht 5\' 3"  (1.6 m)   Wt 96.2 kg   SpO2 98%   BMI 37.55 kg/m   Physical Exam Constitutional:      Appearance: She is well-developed.  Neck:     Musculoskeletal: Normal range of motion.  Pulmonary:     Effort: Pulmonary effort is normal.  Abdominal:     General: There is no distension.     Palpations:  Abdomen is soft.     Tenderness: There is no abdominal tenderness.  Skin:    General: Skin is warm and dry.  Neurological:     Mental Status: She is alert and oriented to person, place, and time.      ED Treatments / Results  Labs (all labs ordered are listed, but only abnormal results are displayed) Labs Reviewed  URINALYSIS, [place], URINE    EKG    Radiology No results found.  Procedures Procedures (including critical care time)  Medications Ordered in ED Medications - No data to display   Initial Impression / Assessment and Plan / ED Course  I have reviewed the triage vital signs and the nursing notes.  Pertinent labs & imaging results that were available during my care of the patient were reviewed by me and considered in my medical decision making (see chart for details).        Patient to ED with quick onset urinary frequency, dysuria and hematuria this evening. No fever, vomiting, back/flank pain.  Patient continues to have frequent urination in the ED. She is overall nontoxic in appearance. In NAD.   UA is nitrite positive, WBC's, many RBCs. Feel UA c/w clinical picture of urinary tract infection. Will start on Keflex. She is appropriate for discharge home.   Final Clinical Impressions(s) / ED Diagnoses   Final diagnoses:  None   1. Hemorrhagic cystitis  ED Discharge Orders    None       Charlann Lange, PA-C 08/03/18 1610    Orpah Greek, MD 08/04/18 [phone]

## 2018-08-18 ENCOUNTER — Ambulatory Visit: Payer: PPO | Admitting: Neurology

## 2018-08-18 ENCOUNTER — Encounter

## 2018-09-09 ENCOUNTER — Ambulatory Visit
Admission: RE | Admit: 2018-09-09 | Discharge: 2018-09-09 | Disposition: A | Discharge status: Home / Self Care | Payer: PPO | Source: Ambulatory Visit | Attending: Neurology | Admitting: Neurology

## 2018-09-09 DIAGNOSIS — G35 Multiple sclerosis: Secondary | ICD-10-CM

## 2018-09-09 DIAGNOSIS — M542 Cervicalgia: Secondary | ICD-10-CM

## 2018-09-09 MED ORDER — GADOBENATE DIMEGLUMINE 529 MG/ML IV SOLN
20.0000 mL | Freq: Once | INTRAVENOUS | Status: AC | PRN
  Administered 2018-09-09: 20 mL via INTRAVENOUS

## 2018-09-11 ENCOUNTER — Other Ambulatory Visit: Payer: Self-pay

## 2018-09-11 ENCOUNTER — Ambulatory Visit: Payer: Self-pay

## 2018-09-11 ENCOUNTER — Encounter: Payer: Self-pay | Admitting: Family Medicine

## 2018-09-11 ENCOUNTER — Ambulatory Visit (INDEPENDENT_AMBULATORY_CARE_PROVIDER_SITE_OTHER): Payer: PPO | Admitting: Family Medicine

## 2018-09-11 DIAGNOSIS — M25562 Pain in left knee: Secondary | ICD-10-CM

## 2018-09-11 MED ORDER — DICLOFENAC SODIUM 1 % TD GEL: 4.0000 g | Freq: Four times a day (QID) | TRANSDERMAL | 6 refills | Status: DC | PRN

## 2018-09-11 NOTE — Patient Instructions (Signed)
   Glucosamine sulfate:  1,000 mg twice daily  Turmeric:  500 mg twice daily

## 2018-09-11 NOTE — Progress Notes (Signed)
Office Visit Note   Patient: [name]           Date of Birth: Jul 23, 1975           MRN: [id number] Visit Date: 09/11/2018 Requested by: Kelton Pillar, MD [address] [place],  [place] [address] PCP: Kelton Pillar, MD  Subjective: Chief Complaint  Patient presents with  . Left Knee - Pain    Pain lateral knee x 1&1/2 months. Instability and popping. Anterior proximal lower leg pain this week. The left leg was planted while she was doing round-house kicks over her daughter's head.    HPI: She is here with left knee pain.  About 6 months ago she was playing with her daughter and seeing whether she could still kick her leg over her daughter's head.  She planted her left foot, lifted her right leg up and laterally and she felt something pop in her left knee.  Initial pain was on the lateral aspect, she fell to the ground.  It hurt but she was eventually able to get back up but her knee has not been right since then.  She cannot walk, it hurts to give up and down stairs.  Recently she had to go up steep stairs full and it aggravated her knee in a different area.  She is not taking any medication for the pain, no previous problems with her knee.  It does not lock.  She has not noticed any swelling.               ROS: No fevers or chills.  All other systems were reviewed and are negative.  Objective: Vital Signs: There were no vitals taken for this visit.  Physical Exam:  General:  Alert and oriented, in no acute distress. Pulm:  Breathing unlabored. Psy:  Normal mood, congruent affect. Skin: No rash or erythema. Left knee: No significant effusion, trace patellofemoral crepitus.  No significant pain with patella compression or apprehension test.  Lockman's feels solid, PCL feels solid, no laxity with varus or valgus stress.  Slightly tender over the lateral joint line but no palpable click with McMurray's.  Slightly tender near the tibial tubercle.   Imaging:  X-rays left knee: Bilateral mild to moderate medial compartment joint space narrowing.  No sign of stress fracture or loose body.    Assessment & Plan: 1.  Left knee pain, suspicious for lateral meniscus injury. -Trial of Voltaren gel.  If symptoms persist, then MRI scan or possibly one-time cortisone injection.     Procedures: No procedures performed  No notes on file     PMFS History: Patient Active Problem List   Diagnosis Date Noted  . Hypersomnia, idiopathic 07/22/2018  . Vitamin D deficiency 07/22/2018  . Neck pain 01/10/2017  . Headache 09/09/2016  . Elevated intracranial pressure 09/09/2016  . Numbness 01/02/2016  . Gait disorder 05/17/2015  . Hypersomnia 05/17/2015  . Depression 05/17/2015  . History of optic neuritis 05/17/2015  . 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 Insomnia 05/17/2015  . Attention deficit disorder 05/17/2015  . Depression, major, single episode, severe ([place]) 03/04/2012  . MS (multiple sclerosis) ([place]) 01/21/2012   Past Medical History:  Diagnosis Date  . Anxiety   . Depression   . MS (multiple sclerosis) ([place])    Diagnosed in 2005  . MS (multiple sclerosis) ([place])     Family History  Problem Relation Age of Onset  . Depression Mother     History reviewed. No  pertinent surgical history. Social History   Occupational History  . Not on file  Tobacco Use  . Smoking status: Former Smoker    Types: Cigarettes  . Smokeless tobacco: Never Used  Substance and Sexual Activity  . Alcohol use: Yes    Alcohol/week: 0.0 standard drinks    Comment: occasional/fim  . Drug use: No  . Sexual activity: Not on file

## 2018-09-14 ENCOUNTER — Telehealth: Payer: Self-pay | Admitting: Neurology

## 2018-09-14 DIAGNOSIS — F331 Major depressive disorder, recurrent, moderate: Secondary | ICD-10-CM | POA: Diagnosis not present

## 2018-09-14 DIAGNOSIS — F411 Generalized anxiety disorder: Secondary | ICD-10-CM | POA: Diagnosis not present

## 2018-09-14 DIAGNOSIS — M2550 Pain in unspecified joint: Secondary | ICD-10-CM

## 2018-09-14 NOTE — Telephone Encounter (Signed)
I discussed the results of the MRI.  The MRI of the cervical spine showed several upper cervical spine lesions.  2 of these were present on the 2011 MRI and there is at least 1 or 2 more lesions.  The MRI of the brain was compared to an MRI from August 2018.  In the interim she has had 3 new lesions, none of which enhance.  I discussed with her that there was some breakthrough activity evident over the last 2 years (spinal lesions could have occurred anytime for the last 9 years).  She notes that she was off of the leflunomide for about 3 months.  Therefore, it is uncertain if the breakthrough occurred due to noncompliance or to an adequate efficacy.  She tolerates the leflunomide well and would like to stay on it.  We discussed check another MRI in 1 year to reassess stability.  She continues to have neck pain and other joint pain.  We will check rheumatoid factor, uric acid and ESR.

## 2018-09-15 DIAGNOSIS — Z87440 Personal history of urinary (tract) infections: Secondary | ICD-10-CM | POA: Diagnosis not present

## 2018-09-15 DIAGNOSIS — R309 Painful micturition, unspecified: Secondary | ICD-10-CM | POA: Diagnosis not present

## 2018-09-15 DIAGNOSIS — N3090 Cystitis, unspecified without hematuria: Secondary | ICD-10-CM | POA: Diagnosis not present

## 2018-09-22 DIAGNOSIS — F331 Major depressive disorder, recurrent, moderate: Secondary | ICD-10-CM | POA: Diagnosis not present

## 2018-09-22 DIAGNOSIS — F411 Generalized anxiety disorder: Secondary | ICD-10-CM | POA: Diagnosis not present

## 2018-10-05 DIAGNOSIS — F411 Generalized anxiety disorder: Secondary | ICD-10-CM | POA: Diagnosis not present

## 2018-10-05 DIAGNOSIS — F331 Major depressive disorder, recurrent, moderate: Secondary | ICD-10-CM | POA: Diagnosis not present

## 2018-10-07 DIAGNOSIS — Z20828 Contact with and (suspected) exposure to other viral communicable diseases: Secondary | ICD-10-CM | POA: Diagnosis not present

## 2018-10-15 DIAGNOSIS — F411 Generalized anxiety disorder: Secondary | ICD-10-CM | POA: Diagnosis not present

## 2018-10-15 DIAGNOSIS — F331 Major depressive disorder, recurrent, moderate: Secondary | ICD-10-CM | POA: Diagnosis not present

## 2018-10-22 DIAGNOSIS — F411 Generalized anxiety disorder: Secondary | ICD-10-CM | POA: Diagnosis not present

## 2018-10-22 DIAGNOSIS — F331 Major depressive disorder, recurrent, moderate: Secondary | ICD-10-CM | POA: Diagnosis not present

## 2018-10-26 DIAGNOSIS — F411 Generalized anxiety disorder: Secondary | ICD-10-CM | POA: Diagnosis not present

## 2018-10-26 DIAGNOSIS — F331 Major depressive disorder, recurrent, moderate: Secondary | ICD-10-CM | POA: Diagnosis not present

## 2018-11-02 DIAGNOSIS — F411 Generalized anxiety disorder: Secondary | ICD-10-CM | POA: Diagnosis not present

## 2018-11-02 DIAGNOSIS — F331 Major depressive disorder, recurrent, moderate: Secondary | ICD-10-CM | POA: Diagnosis not present

## 2018-11-17 DIAGNOSIS — F331 Major depressive disorder, recurrent, moderate: Secondary | ICD-10-CM | POA: Diagnosis not present

## 2018-11-17 DIAGNOSIS — F411 Generalized anxiety disorder: Secondary | ICD-10-CM | POA: Diagnosis not present

## 2018-11-24 DIAGNOSIS — F411 Generalized anxiety disorder: Secondary | ICD-10-CM | POA: Diagnosis not present

## 2018-11-24 DIAGNOSIS — F331 Major depressive disorder, recurrent, moderate: Secondary | ICD-10-CM | POA: Diagnosis not present

## 2018-12-03 IMAGING — DX DG CHEST 2V
2 series · 2 of 2 positions shown · non-contrast
Comparison: Chest x-ray report dated August 31, 2002.

CLINICAL DATA: Left arm numbness and burning sensation for the past
week.

EXAM:
CHEST - 2 VIEW

[dg chest 2 view (1 of 2)]
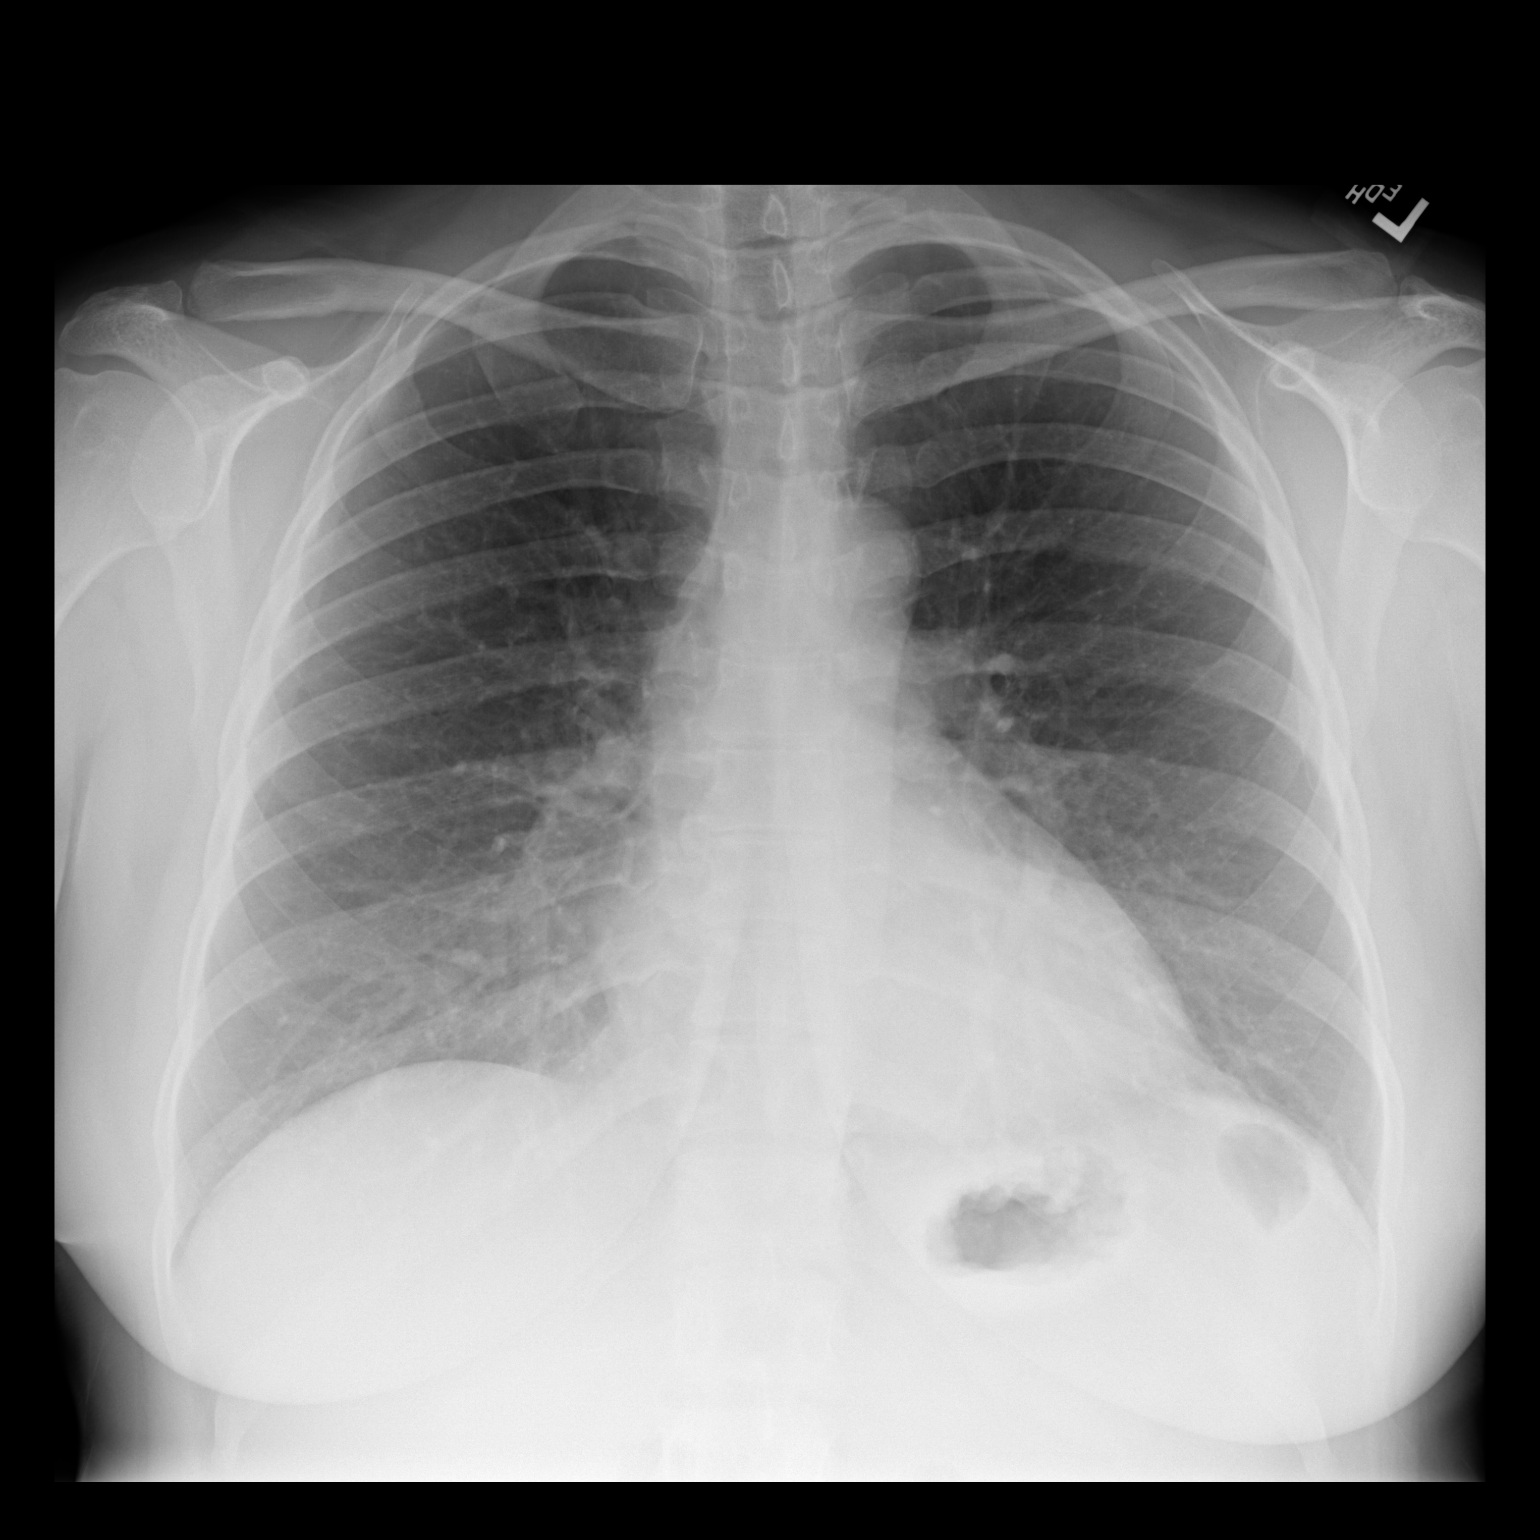

[dg chest 2 view (2 of 2)]
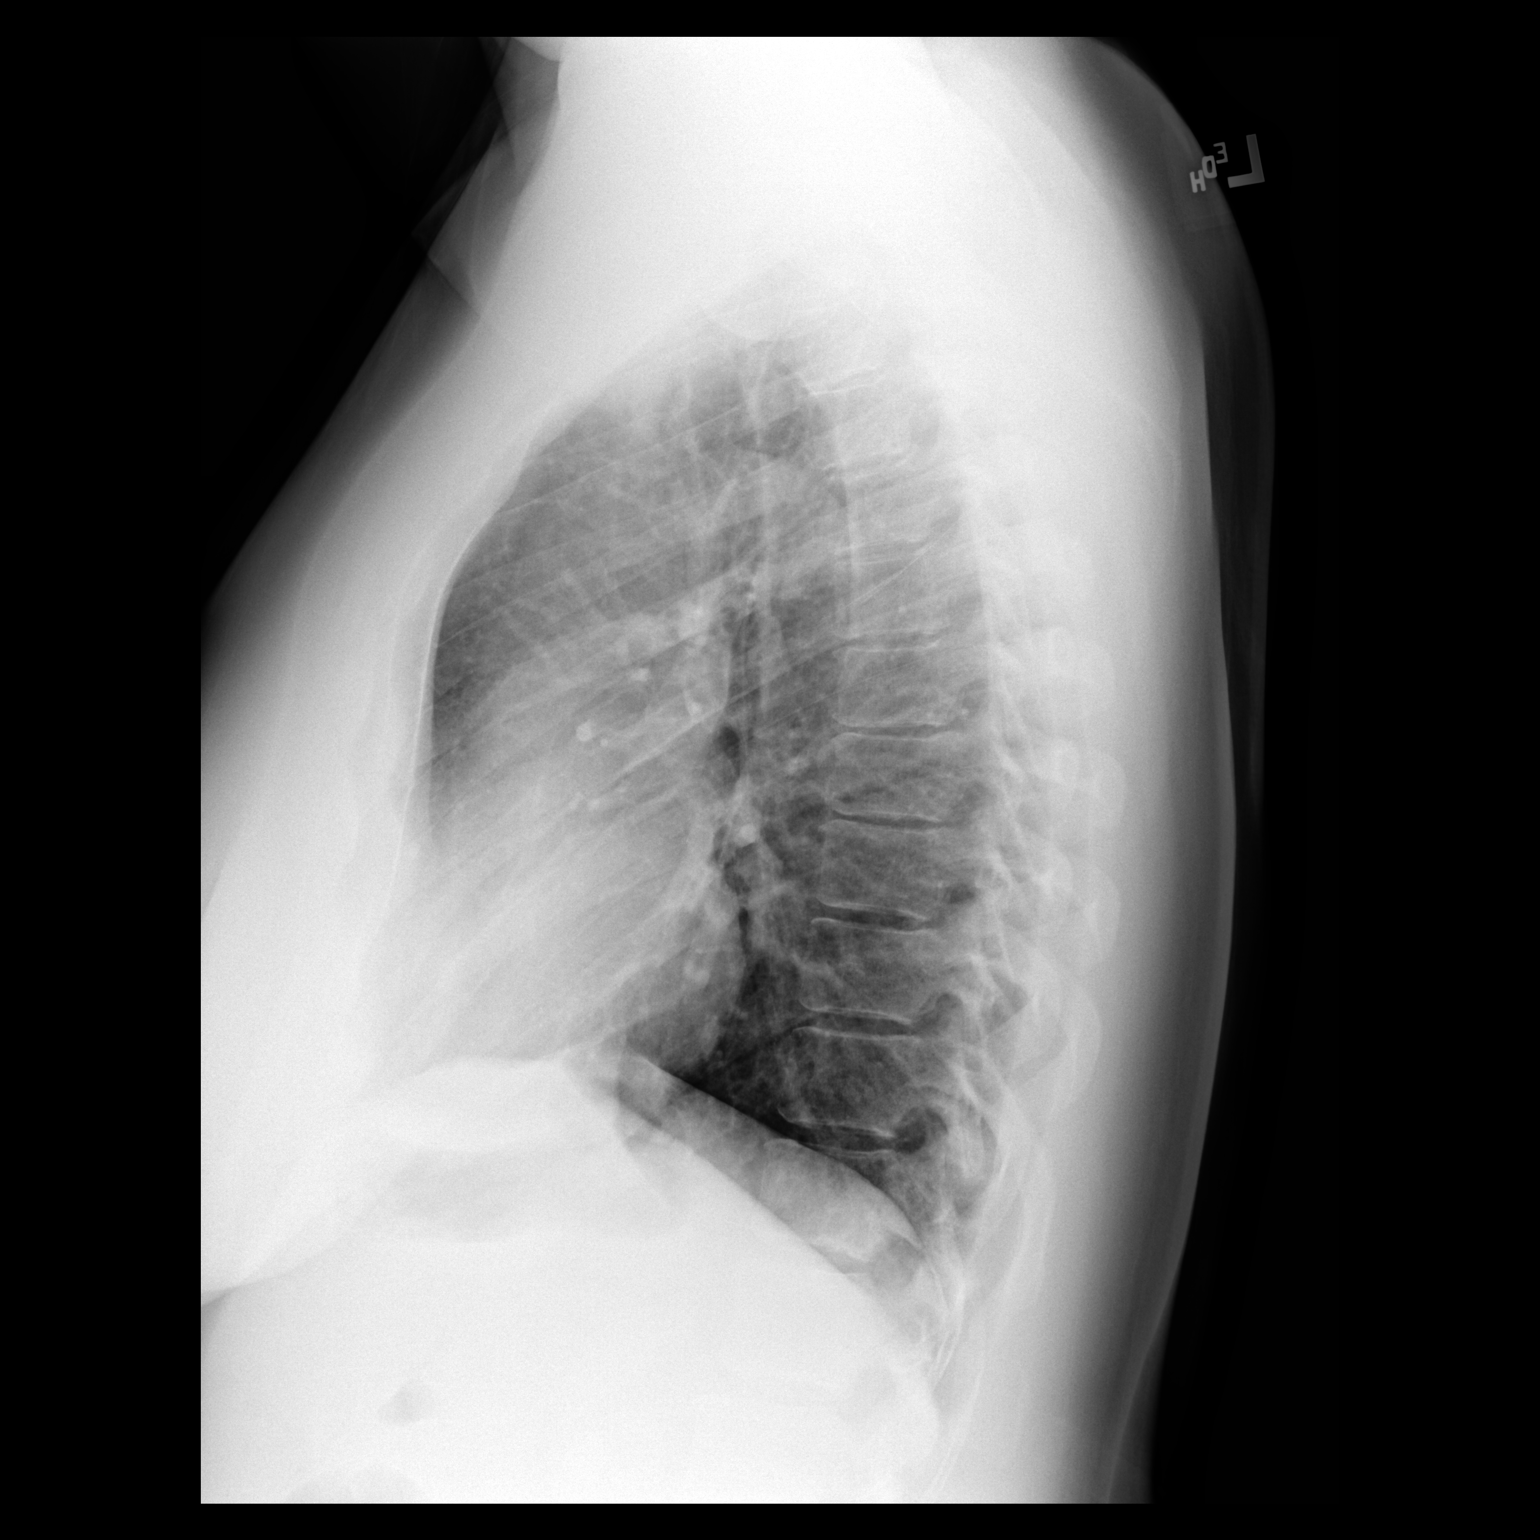

[2 of 2 positions shown; findings below may reference images not displayed]

FINDINGS: The heart size and mediastinal contours are within normal limits.
Normal pulmonary vascularity. No focal consolidation, pleural
effusion, or pneumothorax. No acute osseous abnormality. No cervical
rib.
IMPRESSION: Normal chest x-ray.

## 2018-12-07 DIAGNOSIS — F411 Generalized anxiety disorder: Secondary | ICD-10-CM | POA: Diagnosis not present

## 2018-12-07 DIAGNOSIS — F331 Major depressive disorder, recurrent, moderate: Secondary | ICD-10-CM | POA: Diagnosis not present

## 2018-12-12 IMAGING — MR MR CHEST MEDIASTINUM WO/W CM
11 series · 16 of 16 positions shown · IV contrast (multihance)
Comparison: None.

CLINICAL DATA: Left arm and neck pain radiating into the left hand
fingers for 1-2 months. No known injury.

Creatinine was obtained on site at [HOSPITAL] at [HOSPITAL].
Results: Creatinine 0.8 mg/dL.
EXAM:
MRI LEFT BRACHIAL PLEXUS WITHOUT CONTRAST
TECHNIQUE: Multiplanar, multiecho pulse sequences of the neck and surrounding
structures were obtained without intravenous contrast. The field of
view was focused on the leftbrachial plexus from the neural foramina
to the axilla.
CONTRAST:  20 ml MULTIHANCE GADOBENATE DIMEGLUMINE 529 MG/ML IV SOLN

[Series 3: T1 · axial · 5.0mm · 1.17mm/px · 1 of 52 slices shown (1 of 3)]
[im 1/52]
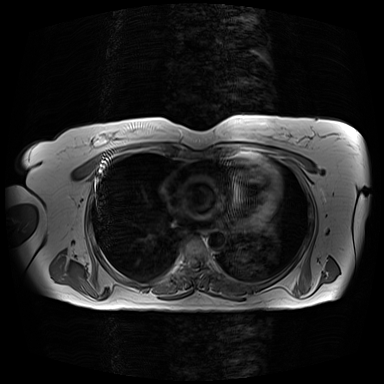

[Series 4: T2 fat-sat · axial · 5.0mm · 1.17mm/px · 1 of 52 slices shown]
[im 1/52]
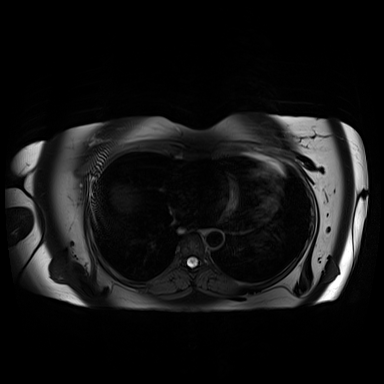

[Series 5: T1 · coronal · 5.0mm · 0.70mm/px · 1 of 28 slices shown (2 of 3)]
[im 1/28]
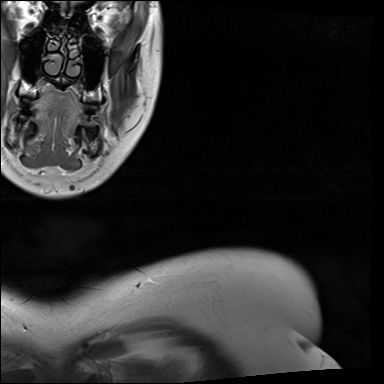

[Series 6: STIR · coronal · 5.0mm · 0.75mm/px · 1 of 28 slices shown (1 of 2)]
[im 1/28]
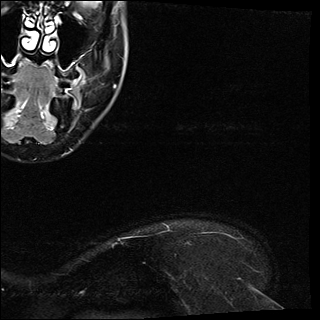

[Series 7: T1 fat-sat · axial · non-contrast · 5.0mm · 1.17mm/px · 1 of 52 slices shown]
[im 1/52]
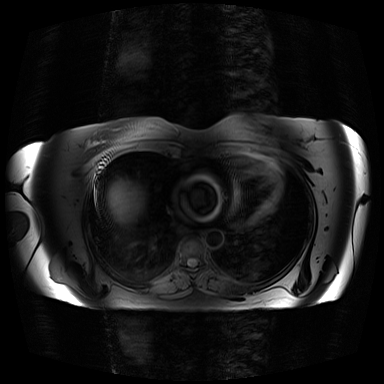

[Series 8: STIR · axial · 5.0mm · 1.76mm/px · z∈[-362,-56]mm · 2 of 52 slices shown (2 of 2)]
[im 1/52]
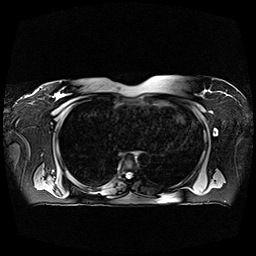
[im 52/52]
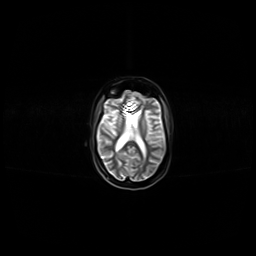

[Series 9: T1 · sagittal · 4.0mm · 0.68mm/px · 2 of 54 slices shown (3 of 3)]
[im 1/54]
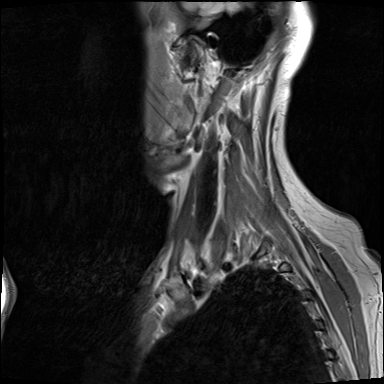
[im 54/54]
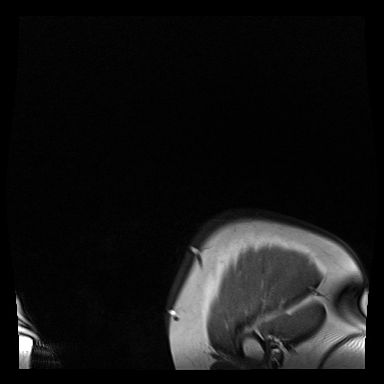

[Series 10: T2 · sagittal · 4.0mm · 0.68mm/px · 2 of 54 slices shown]
[im 1/54]
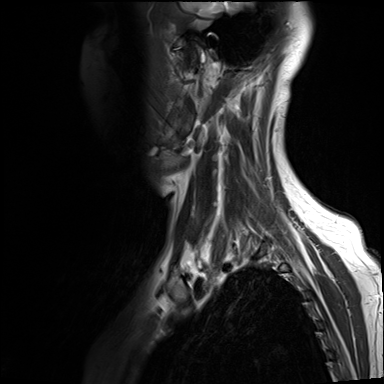
[im 54/54]
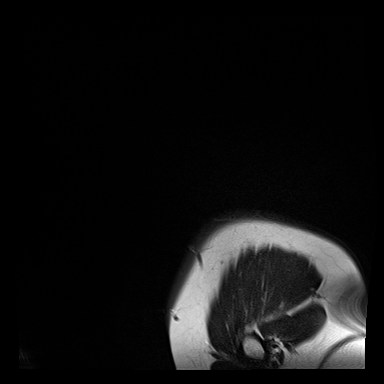

[Series 11: T1 fat-sat post-contrast · axial · 5.0mm · 1.17mm/px · z∈[-362,-56]mm · 2 of 52 slices shown (1 of 3)]
[im 1/52]
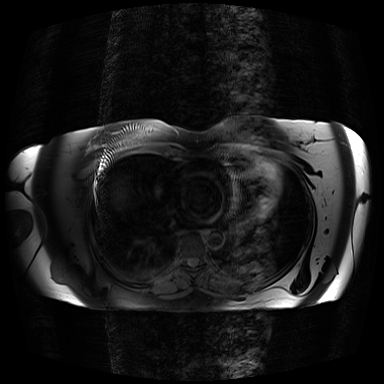
[im 52/52]
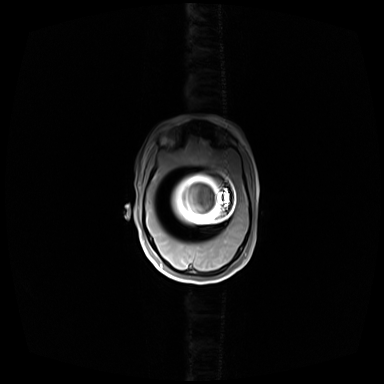

[Series 12: T1 fat-sat post-contrast · coronal · 5.0mm · 0.62mm/px · 1 of 28 slices shown (2 of 3)]
[im 1/28]
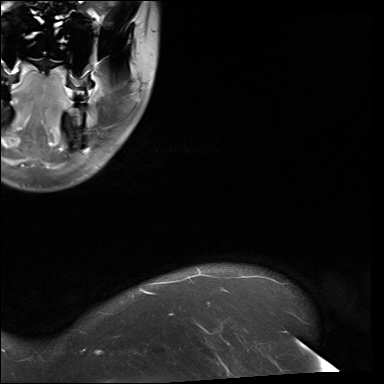

[Series 13: T1 fat-sat post-contrast · sagittal · 4.0mm · 0.68mm/px · 2 of 54 slices shown (3 of 3)]
[im 1/54]
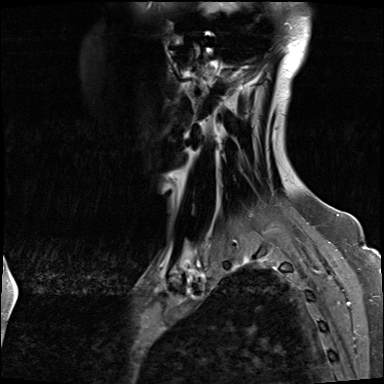
[im 54/54]
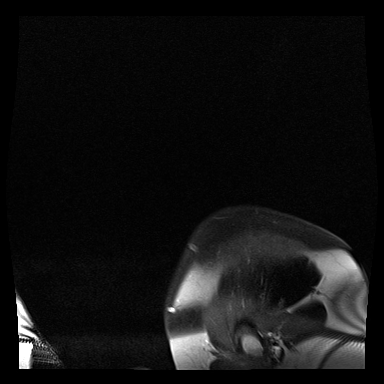

[16 of 16 positions shown; findings below may reference images not displayed]

FINDINGS: Spinal cord

Artifact on all sequences makes evaluation extremely difficult.

Brachial plexus:

Normal. No mass or fluid collection within or impinging upon the
brachial plexus is identified

Muscles and tendons

Normal.  No atrophy or focal lesion.

Bones

Normal marrow signal throughout.

Joints

Mild to moderate acromioclavicular osteoarthritis is noted.

Other findings

None
IMPRESSION: The cervical cord is extremely difficult to evaluate on this
examination due to artifact. It does not appear normal on any
sequence. If there is concern for myelopathic change, cervical spine
MRI is recommended for further evaluation.

Normal-appearing brachial plexus.

Moderate acromioclavicular osteoarthritis.

## 2019-01-12 ENCOUNTER — Telehealth: Payer: Self-pay | Admitting: Neurology

## 2019-01-12 DIAGNOSIS — Z20828 Contact with and (suspected) exposure to other viral communicable diseases: Secondary | ICD-10-CM | POA: Diagnosis not present

## 2019-01-12 DIAGNOSIS — M62838 Other muscle spasm: Secondary | ICD-10-CM

## 2019-01-12 MED ORDER — BACLOFEN 10 MG PO TABS: 10.0000 mg | ORAL_TABLET | Freq: Three times a day (TID) | ORAL | 5 refills | Status: DC

## 2019-01-12 NOTE — Telephone Encounter (Signed)
Patient called stating that with her MS she is having bad muscle spams and would like to know if she could be prescribed a muscle relaxer to help.    Patient states she has tried alleve, stretching, biofreeze but none are working for her. Please follow up.

## 2019-01-12 NOTE — Telephone Encounter (Signed)
Meds: Baclofen 10 mg p.o. 3 times daily.\  #90 #5

## 2019-01-12 NOTE — Telephone Encounter (Signed)
Called and spoke with pt. Relayed Dr. Garth Bigness recommendation. She is agreeable to try this. I escribed rx. She also relayed she is getting tested for covid today. She was in close contact with someone at [place] for more than 15 min. She will let us know results of her test once back.

## 2019-01-12 NOTE — Telephone Encounter (Signed)
Dr. Sater- please advise 

## 2019-01-21 DIAGNOSIS — F331 Major depressive disorder, recurrent, moderate: Secondary | ICD-10-CM | POA: Diagnosis not present

## 2019-01-21 DIAGNOSIS — F411 Generalized anxiety disorder: Secondary | ICD-10-CM | POA: Diagnosis not present

## 2019-01-28 ENCOUNTER — Ambulatory Visit: Payer: PPO | Admitting: Neurology

## 2019-02-01 ENCOUNTER — Encounter: Payer: Self-pay | Admitting: Neurology

## 2019-02-02 DIAGNOSIS — F411 Generalized anxiety disorder: Secondary | ICD-10-CM | POA: Diagnosis not present

## 2019-02-02 DIAGNOSIS — F331 Major depressive disorder, recurrent, moderate: Secondary | ICD-10-CM | POA: Diagnosis not present

## 2019-02-18 DIAGNOSIS — F331 Major depressive disorder, recurrent, moderate: Secondary | ICD-10-CM | POA: Diagnosis not present

## 2019-02-18 DIAGNOSIS — F411 Generalized anxiety disorder: Secondary | ICD-10-CM | POA: Diagnosis not present

## 2019-04-06 ENCOUNTER — Other Ambulatory Visit: Payer: Self-pay | Admitting: Neurology

## 2019-04-12 ENCOUNTER — Ambulatory Visit: Payer: PPO | Admitting: Neurology

## 2019-04-12 DIAGNOSIS — F411 Generalized anxiety disorder: Secondary | ICD-10-CM | POA: Diagnosis not present

## 2019-04-12 DIAGNOSIS — F331 Major depressive disorder, recurrent, moderate: Secondary | ICD-10-CM | POA: Diagnosis not present

## 2019-05-13 DIAGNOSIS — R300 Dysuria: Secondary | ICD-10-CM | POA: Diagnosis not present

## 2019-05-13 DIAGNOSIS — I10 Essential (primary) hypertension: Secondary | ICD-10-CM | POA: Diagnosis not present

## 2019-05-13 DIAGNOSIS — G35 Multiple sclerosis: Secondary | ICD-10-CM | POA: Diagnosis not present

## 2019-06-14 ENCOUNTER — Other Ambulatory Visit: Payer: Self-pay

## 2019-06-14 ENCOUNTER — Ambulatory Visit: Payer: PPO | Admitting: Neurology

## 2019-06-14 ENCOUNTER — Encounter: Payer: Self-pay | Admitting: Neurology

## 2019-06-14 VITALS — BP 122/78 | HR 80 | Temp 97.7°F | Ht 63.0 in | Wt 195.5 lb

## 2019-06-14 DIAGNOSIS — Z79899 Other long term (current) drug therapy: Secondary | ICD-10-CM

## 2019-06-14 DIAGNOSIS — E559 Vitamin D deficiency, unspecified: Secondary | ICD-10-CM | POA: Diagnosis not present

## 2019-06-14 DIAGNOSIS — R5382 Chronic fatigue, unspecified: Secondary | ICD-10-CM | POA: Diagnosis not present

## 2019-06-14 DIAGNOSIS — G35 Multiple sclerosis: Secondary | ICD-10-CM | POA: Diagnosis not present

## 2019-06-14 MED ORDER — LEFLUNOMIDE 20 MG PO TABS: 20.0000 mg | ORAL_TABLET | Freq: Every day | ORAL | 3 refills | Status: DC

## 2019-06-14 MED ORDER — ARMODAFINIL 150 MG PO TABS: ORAL_TABLET | ORAL | 5 refills | Status: DC

## 2019-06-14 MED ORDER — ALPRAZOLAM 1 MG PO TABS: 1.0000 mg | ORAL_TABLET | Freq: Every evening | ORAL | 5 refills | Status: DC | PRN

## 2019-06-14 NOTE — Progress Notes (Signed)
[facility]  PATIENT: [name] DOB: July 14, 1975  REFERRING DOCTOR OR PCP:  Kelton Pillar SOURCE: [patient, records from [facility], MRI images on PACS  _________________________________   HISTORICAL  CHIEF COMPLAINT:  Chief Complaint  Patient presents with  . Follow-up    RM 13, alone. Last seen 07/22/2018. She is doing well, no concerns.   . Multiple Sclerosis    On leflunomide.     HISTORY OF PRESENT ILLNESS:  [name] Is a 44 year old woman with relapsing remitting multiple sclerosis.    She was diagnosed with MS in 2008.    Update 06/14/2019: She has RRMS.   She has been prescribed leflunomide but has not been compliant lately. MRI 09/10/2018 was when she was off med's > 6 months.   Gait is baseline with no falls.  Sometimes balance is off.  Her left leg is mildly weak.   She gets some numbness and tingling in the left hand.  Thi is not troublesome.   She has had frequent UTI's, 4 in last year.   She has mild hesitancy but feels she empties.      She notes fatigue.  She is sleeping well.  Armodafinil was better tolerated than Adderal IR (rush/crash).     She has depression and is on Vibriid.    She has not had the Covid-19 vaccination and we have discussed this and sh plans to go ahead and schedule  She took high dose Vit D bit has not taken OTC supplements since.    Update 07/02/2017: She is doing well with her MS and is tolerating [place] well.    Her last MRI 09/2016 showed no new lesions.  She feels her gait is doing well.  She denies any weakness.  There are some dysesthesias.    She also has discomfort in the left arm and her veins seem to pop out a lot.    Discomfort worsens when the arm is bent.  Bladder function is fine.  She continues to note some difficulty with fatigue and sleepiness and also has some depression.  In the past she was on fluoxetine and felt her mood was doing better.  She has been on Adderall off and on but  feels the left arm symptoms are worse when she takes it.  She continues to have a throbbing pain in the right vertex.  Pain is always present but will fluctuate with certain activity.    Pain radiates a few inches.     Pain is worse with sneezing, pressure, exercise and is least likely to occur at rest though it still occurs at times.   In the past a nerve block had not helped      In the past, she had a LP for the headache when she went to the ED and OP was slightly elevated at 23.        Update 01/10/2017:    She feels her MS is mostly stable.  She was unable to get good patient assistance for Aubagio and co-pays were extremely high at $2000 a month. She switched to leflunomide. She still pays $200 a month and we found places where she can get the medication for less.   She tolerates it well. The gait is baseline with mild reduced balance and some left-sided weakness when she climbs stairs. She does not note any significant numbness currently.    Vision is baseline. She did have optic neuritis in 2015 but improved. Bladder function is  doing well.   She continues to note fatigue that is helped by Adderall. She does better on the immediate release than the time release. She is sleeping better and only has to take temazepam now and then. She has some depression and anxiety but mood is generally doing well.   She gets stabbing pain in her head when she coughs or sneeze.  There is no pain now in between these type of maneuvers.  She also has some tenderness at the right occiput.   When she went to the emergency room earlier this year she had a lumbar puncture and opening pressure was mildly elevated.  Acetazolamide did not help.   There was no MRI evidence of elevated intracranial pressure.  From 09/09/2016: HA:   She has had pain in the the right vertex/forehead.  This has been a dull ache but intensifies with Valsalva, cough or sneeze.   There is milder occipital pain.   She went to the ED.   she underwent  a lumbar puncture. The CSF was normal. However, opening pressure was mildly elevated at 23 cm.    Papilledema was not noted. Head CT was normal.   I reviewed the notes from the emergency room, laboratory and imaging results and the CT images on PACS.  MS:  Late last year, she started [place] but had insurance issues and stopped.    She tried to get patient assistance but grant money was not available at that time.    Right now she has Medicare with [employer] (husband) as secondary but she is going through a divorce and would not be on his policy soon.  She had a small exacerbation in  2017. MRI of her brain from 06/02/15 shows multiple T2/FLAIR hyperintense foci in a pattern and configuration consistent with chronic demyelinating plaque associated with MS.  COmpared to an MRI from 2009, there has been some progression in the number foci and also some progression in the extent of cortical atrophy. She also has a left frontal benign-appearing developmental venous anomaly.    Gait/strength/sensation: She denies any significant difficulties with her gait but balance is mildly off. There is mild left sided weakness that she notes most when climbing stairs.  . She does note some tingling in the fingertips bilaterally but not in the legs.  Vision: She had an episode of optic neuritis in 2015.. Currently, she denies any significant difficulties with visual acuity. There is no diplopia. There is no eye pain.  Bladder/bowel: She denies any difficulties with her bladder. Specifically, there is not urgency, frequency, hesitancy. There is no constipation.  Fatigue/sleep:  She has physical and cognitive fatigue. She also has some daytime sleepiness.   Adderall has helped some.  She often prefers the immediate release over the extended release.    A PSG 11/16/2010 showed no OSA. Sleep latency was delayed. MSLT showed hypersomnia with a sleep latency of 5.4 minutes and one sleep onset REM.   Another PSG March 2012 showed a  reduced sleep latency of 6 minutes with 3 of 5 showing REM sleep.  The hypersomnia also improved with the stimulants.   She has insomnia some night.   Temazepam has helped the most.  Mood:    She has had depression off and on. Despite going through a divorce, she does not feel her mood is worse this year..  She has a 6 and 62 year old child.       Cognitive:   She has had some difficulty with cognition  noticing issues with short-term memory, attention, executive function and verbal fluency. Stimulants have helped some.    MS History:    In 2008, she presented with left-sided numbness and weakness. She also difficulty with her gait. An MRI of the brain was consistent with multiple sclerosis. She was seeing Dr. Brett Fairy at the time.   =Initially, she was placed on Copaxone after she was diagnosed in 2008 but stopped after a year due to MRI changes.   Then, she was placed on Betaseron but she had an exacerbation and was referred to me. We started  Tysabri in 2012. She did well on Tysabri but was high titer JCV antibody positive and stopped after a year or so.  She was started on Gilenya but stopped due to shortness of breath.  She then went on Tecfidera. She had difficulty tolerating it.    She had left greater than right arm numbness 07/05/2012 and received 3 days of IV Solu-Medrol. Of note, she had stopped Tecfidera 2 months earlier because of GI issues. MRI of the cervical spine in 2011 showed a focus adjacent to C3. An MRI performed May 2014 showed 3 enhancing lesions.   She switched to Aubagio.    An MRI of the brain 07/20/2013 at [facility] was unchanged compared to a previous one from 07/10/2012.   She had an exacerbation 12/20/2013 with right optic neuritis and received several days of IV Solu-Medrol. Of note, she had run out of Aubagio a couple months earlier.        REVIEW OF SYSTEMS: Constitutional: No fevers, chills, sweats, or change in appetite.   She has fatigue, daytime sleepiness and  nighttime insomnia Eyes: No visual changes, double vision, eye pain Ear, nose and throat: No hearing loss, ear pain, nasal congestion, sore throat Cardiovascular: No chest pain, palpitations Respiratory: No shortness of breath at rest or with exertion.   No wheezes GastrointestinaI: No nausea, vomiting, diarrhea, abdominal pain, fecal incontinence Genitourinary: No dysuria, urinary retention or frequency.  No nocturia. Musculoskeletal: No neck pain, back pain Integumentary: No rash, pruritus, skin lesions Neurological: as above Psychiatric: As above Endocrine: No palpitations, diaphoresis, change in appetite, change in weigh or increased thirst Hematologic/Lymphatic: No anemia, purpura, petechiae. Allergic/Immunologic: No itchy/runny eyes, nasal congestion, recent allergic reactions, rashes  ALLERGIES: No Known Allergies  HOME MEDICATIONS:  Current Outpatient Medications:  .  ALPRAZolam (XANAX) 1 MG tablet, Take 1 tablet (1 mg total) by mouth at bedtime as needed for anxiety., Disp: 30 tablet, Rfl: 5 .  amLODipine (NORVASC) 5 MG tablet, Take 5 mg by mouth daily., Disp: , Rfl:  .  Armodafinil 150 MG tablet, Take 1/2 to 1 pill po qAM, Disp: 30 tablet, Rfl: 5 .  baclofen (LIORESAL) 10 MG tablet, Take 1 tablet (10 mg total) by mouth 3 (three) times daily., Disp: 90 each, Rfl: 5 .  indomethacin (INDOCIN) 25 MG capsule, Take 1 capsule (25 mg total) by mouth 3 (three) times daily with meals as needed., Disp: 90 capsule, Rfl: 0 .  leflunomide (ARAVA) 20 MG tablet, Take 1 tablet (20 mg total) by mouth daily., Disp: 90 tablet, Rfl: 3 .  valACYclovir (VALTREX) 500 MG tablet, Take one tablet twice daily as needed for cold sores., Disp: 30 tablet, Rfl: 0 .  Vitamin D, Ergocalciferol, (DRISDOL) 1.25 MG (50000 UT) CAPS capsule, Take 1 capsule (50,000 Units total) by mouth every 7 (seven) days., Disp: 13 capsule, Rfl: 1  PAST MEDICAL HISTORY: Past Medical History:  Diagnosis Date  .  Anxiety   .  Depression   . MS (multiple sclerosis) ([place])    Diagnosed in 2005  . MS (multiple sclerosis) ([place])     PAST SURGICAL HISTORY: No past surgical history on file.  FAMILY HISTORY: Family History  Problem Relation Age of Onset  . Depression Mother     SOCIAL HISTORY:  Social History   Socioeconomic History  . Marital status: Divorced    Spouse name: Not on file  . Number of children: Not on file  . Years of education: Not on file  . Highest education level: Not on file  Occupational History  . Not on file  Tobacco Use  . Smoking status: Former Smoker    Types: Cigarettes  . Smokeless tobacco: Never Used  Substance and Sexual Activity  . Alcohol use: Yes    Alcohol/week: 0.0 standard drinks    Comment: occasional/fim  . Drug use: No  . Sexual activity: Not on file  Other Topics Concern  . Not on file  Social History Narrative  . Not on file   Social Determinants of Health   Financial Resource Strain:   . Difficulty of Paying Living Expenses:   Food Insecurity:   . Worried About Charity fundraiser in the Last Year:   . Arboriculturist in the Last Year:   Transportation Needs:   . Film/video editor (Medical):   Marland Kitchen Lack of Transportation (Non-Medical):   Physical Activity:   . Days of Exercise per Week:   . Minutes of Exercise per Session:   Stress:   . Feeling of Stress :   Social Connections:   . Frequency of Communication with Friends and Family:   . Frequency of Social Gatherings with Friends and Family:   . Attends Religious Services:   . Active Member of Clubs or Organizations:   . Attends Archivist Meetings:   Marland Kitchen Marital Status:   Intimate Partner Violence:   . Fear of Current or Ex-Partner:   . Emotionally Abused:   Marland Kitchen Physically Abused:   . Sexually Abused:      PHYSICAL EXAM  Vitals:   06/14/19 1058  BP: 122/78  Pulse: 80  Temp: 97.7 F (36.5 C)  SpO2: 96%  Weight: 195 lb 8 oz (88.7 kg)  Height: 5\' 3"  (1.6 m)    Body  mass index is 34.63 kg/m.   General: The patient is well-developed and well-nourished and in no acute distress.   There is no tenderness or occipital region or over the temporal arteries.  Funduscopic examination shows normal optic discs and retinal vessels. There was no evidence of papilledema and she had venous pulsations.  Neurologic Exam  Mental status: The patient is alert and oriented x 3 at the time of the examination. The patient has apparent normal recent and remote memory, with an apparently normal attention span and concentration ability.   Speech is normal.  Cranial nerves: Extraocular movements are full.  Facial strength and sensation is normal.  Trapezius strength is normal the tongue is midline, and the patient has symmetric elevation of the soft palate. No obvious hearing deficits are noted.  Motor:  Muscle bulk is normal.   Tone is normal. Strength is  5 / 5 in all 4 extremities.   Sensory: Intact sensation to touch and vibration in the limbs.  Coordination: Cerebellar testing reveals good finger-nose-finger.  Gait and station: Station is normal.   Gait is fairly normal but the tandem gait  is mildly wide.. The Romberg is negative.  Reflexes: Deep tendon reflexes are symmetric.   Leg DTRs are brisk with spread at the knees but no ankle clonus.  .      ASSESSMENT AND PLAN  Vitamin D deficiency - Plan: VITAMIN D 25 Hydroxy (Vit-D Deficiency, Fractures)  Chronic fatigue - Plan: Armodafinil 150 MG tablet  Multiple sclerosis (HCC) - Plan: Armodafinil 150 MG tablet, leflunomide (ARAVA) 20 MG tablet, CBC with Differential/Platelet, Comprehensive metabolic panel  MS (multiple sclerosis) (HCC)  High risk medication use - Plan: CBC with Differential/Platelet, Comprehensive metabolic panel    1.   For MS, we will restart leflunomide (1/2 pill for 2 weeks then go up to 1 pill.  Later in the year we will consider an MRI to make sure that she is not having any subclinical  progression that would make [place] choose a different disease modifying therapy. 2.   Nuvigil for excessive daytime sleepiness and fatigue..   3.  Stay active and exercise as tolerated. 4.   She will return to see me in 4-5 months if stable or call sooner if she is having new or worsening neurologic symptoms.   Samya Siciliano A. Felecia Shelling, MD, PhD [id number], 0000000 PM Certified in Neurology, Clinical Neurophysiology, Sleep Medicine, Pain Medicine and Neuroimaging  [facility] [address], [place], [place] [address] [phone]

## 2019-06-15 ENCOUNTER — Telehealth: Payer: Self-pay | Admitting: *Deleted

## 2019-06-15 LAB — COMPREHENSIVE METABOLIC PANEL
ALT: 26 IU/L (ref 0–32)
AST: 23 IU/L (ref 0–40)
Albumin/Globulin Ratio: 2.2 (ref 1.2–2.2)
Albumin: 4.7 g/dL (ref 3.8–4.8)
Alkaline Phosphatase: 90 IU/L (ref 39–117)
BUN/Creatinine Ratio: 17 (ref 9–23)
BUN: 11 mg/dL (ref 6–24)
Bilirubin Total: 0.3 mg/dL (ref 0.0–1.2)
CO2: 24 mmol/L (ref 20–29)
Calcium: 9.3 mg/dL (ref 8.7–10.2)
Chloride: 100 mmol/L (ref 96–106)
Creatinine, Ser: 0.64 mg/dL (ref 0.57–1.00)
GFR calc Af Amer: 127 mL/min/{1.73_m2} (ref >59)
GFR calc non Af Amer: 110 mL/min/{1.73_m2} (ref >59)
Globulin, Total: 2.1 g/dL (ref 1.5–4.5)
Glucose: 77 mg/dL (ref 65–99)
Potassium: 4.4 mmol/L (ref 3.5–5.2)
Sodium: 138 mmol/L (ref 134–144)
Total Protein: 6.8 g/dL (ref 6.0–8.5)

## 2019-06-15 LAB — CBC WITH DIFFERENTIAL/PLATELET
Basophils Absolute: 0 10*3/uL (ref 0.0–0.2)
Basos: 1 %
EOS (ABSOLUTE): 0.2 10*3/uL (ref 0.0–0.4)
Eos: 2 %
Hematocrit: 41.5 % (ref 34.0–46.6)
Hemoglobin: 14.2 g/dL (ref 11.1–15.9)
Immature Grans (Abs): 0 10*3/uL (ref 0.0–0.1)
Immature Granulocytes: 0 %
Lymphocytes Absolute: 2.1 10*3/uL (ref 0.7–3.1)
Lymphs: 33 %
MCH: 33.2 pg — ABNORMAL HIGH (ref 26.6–33.0)
MCHC: 34.2 g/dL (ref 31.5–35.7)
MCV: 97 fL (ref 79–97)
Monocytes Absolute: 0.8 10*3/uL (ref 0.1–0.9)
Monocytes: 13 %
Neutrophils Absolute: 3.3 10*3/uL (ref 1.4–7.0)
Neutrophils: 51 %
Platelets: 210 10*3/uL (ref 150–450)
RBC: 4.28 x10E6/uL (ref 3.77–5.28)
RDW: 12.9 % (ref 11.7–15.4)
WBC: 6.4 10*3/uL (ref 3.4–10.8)

## 2019-06-15 LAB — VITAMIN D 25 HYDROXY (VIT D DEFICIENCY, FRACTURES): Vit D, 25-Hydroxy: 39.4 ng/mL (ref 30.0–100.0)

## 2019-06-15 NOTE — Telephone Encounter (Signed)
-----   Message from Britt Bottom, MD sent at 06/15/2019  8:52 AM EDT ----- Please let the patient know that the lab work is fine. Vitamin D was in the normal range but try to continue to take some OTC supplements (2000 to 5000 units daily

## 2019-06-15 NOTE — Telephone Encounter (Signed)
Called and spoke with pt about results per Dr. Sater note. She verbalized understanding.  

## 2019-07-13 DIAGNOSIS — Z8279 Family history of other congenital malformations, deformations and chromosomal abnormalities: Secondary | ICD-10-CM | POA: Diagnosis not present

## 2019-08-09 ENCOUNTER — Ambulatory Visit: Payer: PPO | Attending: Internal Medicine

## 2019-08-09 DIAGNOSIS — Z23 Encounter for immunization: Secondary | ICD-10-CM

## 2019-08-09 NOTE — Progress Notes (Signed)
   Covid-19 Vaccination Clinic  Name:  ROSSI SILVESTRO    MRN: [id number] DOB: 1975/09/02  08/09/2019  [name] was observed post Covid-19 immunization for 15 minutes without incident. She was provided with Vaccine Information Sheet and instruction to access the V-Safe system.   [name] was instructed to call 911 with any severe reactions post vaccine: Marland Kitchen Difficulty breathing  . Swelling of face and throat  . A fast heartbeat  . A bad rash all over body  . Dizziness and weakness   Immunizations Administered    Name Date Dose VIS Date Route   Pfizer COVID-19 Vaccine 08/09/2019  4:41 PM 0.3 mL 04/07/2018 Intramuscular   Manufacturer: [employer], [employer]   Lot: [id number]   [place]: [id number]

## 2019-08-24 DIAGNOSIS — M5415 Radiculopathy, thoracolumbar region: Secondary | ICD-10-CM | POA: Diagnosis not present

## 2019-08-24 DIAGNOSIS — M5386 Other specified dorsopathies, lumbar region: Secondary | ICD-10-CM | POA: Diagnosis not present

## 2019-08-24 DIAGNOSIS — M9903 Segmental and somatic dysfunction of lumbar region: Secondary | ICD-10-CM | POA: Diagnosis not present

## 2019-08-24 DIAGNOSIS — M9904 Segmental and somatic dysfunction of sacral region: Secondary | ICD-10-CM | POA: Diagnosis not present

## 2019-08-24 DIAGNOSIS — M9902 Segmental and somatic dysfunction of thoracic region: Secondary | ICD-10-CM | POA: Diagnosis not present

## 2019-08-30 ENCOUNTER — Ambulatory Visit: Payer: PPO | Attending: Internal Medicine

## 2019-09-23 DIAGNOSIS — G35 Multiple sclerosis: Secondary | ICD-10-CM | POA: Diagnosis not present

## 2019-09-23 DIAGNOSIS — N390 Urinary tract infection, site not specified: Secondary | ICD-10-CM | POA: Diagnosis not present

## 2019-09-23 DIAGNOSIS — R300 Dysuria: Secondary | ICD-10-CM | POA: Diagnosis not present

## 2019-09-27 DIAGNOSIS — M5386 Other specified dorsopathies, lumbar region: Secondary | ICD-10-CM | POA: Diagnosis not present

## 2019-09-27 DIAGNOSIS — M9902 Segmental and somatic dysfunction of thoracic region: Secondary | ICD-10-CM | POA: Diagnosis not present

## 2019-09-27 DIAGNOSIS — M9903 Segmental and somatic dysfunction of lumbar region: Secondary | ICD-10-CM | POA: Diagnosis not present

## 2019-09-27 DIAGNOSIS — M9904 Segmental and somatic dysfunction of sacral region: Secondary | ICD-10-CM | POA: Diagnosis not present

## 2019-09-27 DIAGNOSIS — M5415 Radiculopathy, thoracolumbar region: Secondary | ICD-10-CM | POA: Diagnosis not present

## 2019-09-29 DIAGNOSIS — M9902 Segmental and somatic dysfunction of thoracic region: Secondary | ICD-10-CM | POA: Diagnosis not present

## 2019-09-29 DIAGNOSIS — M5415 Radiculopathy, thoracolumbar region: Secondary | ICD-10-CM | POA: Diagnosis not present

## 2019-09-29 DIAGNOSIS — M9904 Segmental and somatic dysfunction of sacral region: Secondary | ICD-10-CM | POA: Diagnosis not present

## 2019-09-29 DIAGNOSIS — M5386 Other specified dorsopathies, lumbar region: Secondary | ICD-10-CM | POA: Diagnosis not present

## 2019-09-29 DIAGNOSIS — M9903 Segmental and somatic dysfunction of lumbar region: Secondary | ICD-10-CM | POA: Diagnosis not present

## 2019-10-04 DIAGNOSIS — M5415 Radiculopathy, thoracolumbar region: Secondary | ICD-10-CM | POA: Diagnosis not present

## 2019-10-04 DIAGNOSIS — M5386 Other specified dorsopathies, lumbar region: Secondary | ICD-10-CM | POA: Diagnosis not present

## 2019-10-04 DIAGNOSIS — M9904 Segmental and somatic dysfunction of sacral region: Secondary | ICD-10-CM | POA: Diagnosis not present

## 2019-10-04 DIAGNOSIS — M9903 Segmental and somatic dysfunction of lumbar region: Secondary | ICD-10-CM | POA: Diagnosis not present

## 2019-10-04 DIAGNOSIS — M9902 Segmental and somatic dysfunction of thoracic region: Secondary | ICD-10-CM | POA: Diagnosis not present

## 2019-10-13 DIAGNOSIS — M5415 Radiculopathy, thoracolumbar region: Secondary | ICD-10-CM | POA: Diagnosis not present

## 2019-10-13 DIAGNOSIS — M9902 Segmental and somatic dysfunction of thoracic region: Secondary | ICD-10-CM | POA: Diagnosis not present

## 2019-10-13 DIAGNOSIS — M9904 Segmental and somatic dysfunction of sacral region: Secondary | ICD-10-CM | POA: Diagnosis not present

## 2019-10-13 DIAGNOSIS — M5386 Other specified dorsopathies, lumbar region: Secondary | ICD-10-CM | POA: Diagnosis not present

## 2019-10-13 DIAGNOSIS — M9903 Segmental and somatic dysfunction of lumbar region: Secondary | ICD-10-CM | POA: Diagnosis not present

## 2019-10-28 DIAGNOSIS — N760 Acute vaginitis: Secondary | ICD-10-CM | POA: Diagnosis not present

## 2019-10-28 DIAGNOSIS — B373 Candidiasis of vulva and vagina: Secondary | ICD-10-CM | POA: Diagnosis not present

## 2019-11-10 DIAGNOSIS — Z779 Other contact with and (suspected) exposures hazardous to health: Secondary | ICD-10-CM | POA: Diagnosis not present

## 2019-11-10 DIAGNOSIS — Z1151 Encounter for screening for human papillomavirus (HPV): Secondary | ICD-10-CM | POA: Diagnosis not present

## 2019-12-14 DIAGNOSIS — R87612 Low grade squamous intraepithelial lesion on cytologic smear of cervix (LGSIL): Secondary | ICD-10-CM | POA: Diagnosis not present

## 2019-12-16 ENCOUNTER — Ambulatory Visit: Payer: PPO | Admitting: Neurology

## 2019-12-16 ENCOUNTER — Encounter: Payer: Self-pay | Admitting: Neurology

## 2019-12-23 DIAGNOSIS — F411 Generalized anxiety disorder: Secondary | ICD-10-CM | POA: Diagnosis not present

## 2019-12-23 DIAGNOSIS — F331 Major depressive disorder, recurrent, moderate: Secondary | ICD-10-CM | POA: Diagnosis not present

## 2019-12-27 DIAGNOSIS — Z1231 Encounter for screening mammogram for malignant neoplasm of breast: Secondary | ICD-10-CM | POA: Diagnosis not present

## 2020-01-14 DIAGNOSIS — F411 Generalized anxiety disorder: Secondary | ICD-10-CM | POA: Diagnosis not present

## 2020-01-14 DIAGNOSIS — F331 Major depressive disorder, recurrent, moderate: Secondary | ICD-10-CM | POA: Diagnosis not present

## 2020-01-20 DIAGNOSIS — R300 Dysuria: Secondary | ICD-10-CM | POA: Diagnosis not present

## 2020-01-24 DIAGNOSIS — F331 Major depressive disorder, recurrent, moderate: Secondary | ICD-10-CM | POA: Diagnosis not present

## 2020-01-24 DIAGNOSIS — F411 Generalized anxiety disorder: Secondary | ICD-10-CM | POA: Diagnosis not present

## 2020-03-02 DIAGNOSIS — Z87440 Personal history of urinary (tract) infections: Secondary | ICD-10-CM | POA: Diagnosis not present

## 2020-05-11 DIAGNOSIS — F411 Generalized anxiety disorder: Secondary | ICD-10-CM | POA: Diagnosis not present

## 2020-05-11 DIAGNOSIS — F331 Major depressive disorder, recurrent, moderate: Secondary | ICD-10-CM | POA: Diagnosis not present

## 2020-05-26 DIAGNOSIS — L738 Other specified follicular disorders: Secondary | ICD-10-CM | POA: Diagnosis not present

## 2020-05-26 DIAGNOSIS — L03314 Cellulitis of groin: Secondary | ICD-10-CM | POA: Diagnosis not present

## 2020-05-30 DIAGNOSIS — I10 Essential (primary) hypertension: Secondary | ICD-10-CM | POA: Diagnosis not present

## 2020-05-30 DIAGNOSIS — L0390 Cellulitis, unspecified: Secondary | ICD-10-CM | POA: Diagnosis not present

## 2020-05-30 DIAGNOSIS — L03314 Cellulitis of groin: Secondary | ICD-10-CM | POA: Diagnosis not present

## 2020-06-05 ENCOUNTER — Telehealth: Payer: Self-pay

## 2020-06-05 DIAGNOSIS — F331 Major depressive disorder, recurrent, moderate: Secondary | ICD-10-CM | POA: Diagnosis not present

## 2020-06-05 DIAGNOSIS — F411 Generalized anxiety disorder: Secondary | ICD-10-CM | POA: Diagnosis not present

## 2020-06-05 NOTE — Telephone Encounter (Signed)
Jillian- please call pt back to schedule an appointment. Thank you

## 2020-06-05 NOTE — Telephone Encounter (Signed)
Pt left a voicemail asking for a call to schedule an appt with Dr. Felecia Shelling.

## 2020-06-14 DIAGNOSIS — L723 Sebaceous cyst: Secondary | ICD-10-CM | POA: Diagnosis not present

## 2020-07-05 DIAGNOSIS — F331 Major depressive disorder, recurrent, moderate: Secondary | ICD-10-CM | POA: Diagnosis not present

## 2020-07-05 DIAGNOSIS — F411 Generalized anxiety disorder: Secondary | ICD-10-CM | POA: Diagnosis not present

## 2020-07-18 ENCOUNTER — Encounter: Payer: Self-pay | Admitting: Neurology

## 2020-07-18 ENCOUNTER — Telehealth: Payer: Self-pay | Admitting: Neurology

## 2020-07-18 ENCOUNTER — Ambulatory Visit: Payer: PPO | Admitting: Neurology

## 2020-07-18 VITALS — BP 128/82 | HR 82 | Ht 63.0 in | Wt 211.0 lb

## 2020-07-18 DIAGNOSIS — G4711 Idiopathic hypersomnia with long sleep time: Secondary | ICD-10-CM | POA: Diagnosis not present

## 2020-07-18 DIAGNOSIS — G2581 Restless legs syndrome: Secondary | ICD-10-CM | POA: Diagnosis not present

## 2020-07-18 DIAGNOSIS — R5382 Chronic fatigue, unspecified: Secondary | ICD-10-CM

## 2020-07-18 DIAGNOSIS — G35 Multiple sclerosis: Secondary | ICD-10-CM | POA: Diagnosis not present

## 2020-07-18 DIAGNOSIS — Z79899 Other long term (current) drug therapy: Secondary | ICD-10-CM

## 2020-07-18 DIAGNOSIS — R5383 Other fatigue: Secondary | ICD-10-CM | POA: Insufficient documentation

## 2020-07-18 DIAGNOSIS — Z8669 Personal history of other diseases of the nervous system and sense organs: Secondary | ICD-10-CM

## 2020-07-18 DIAGNOSIS — M2550 Pain in unspecified joint: Secondary | ICD-10-CM | POA: Diagnosis not present

## 2020-07-18 MED ORDER — ARMODAFINIL 150 MG PO TABS: ORAL_TABLET | ORAL | 5 refills | Status: DC

## 2020-07-18 NOTE — Progress Notes (Signed)
[facility]  PATIENT: [name] DOB: 1976/02/06  REFERRING DOCTOR OR PCP:  Kelton Pillar SOURCE: [patient, records from [facility], MRI images on PACS  _________________________________   HISTORICAL  CHIEF COMPLAINT:  Chief Complaint  Patient presents with  . Follow-up    RM 12. Last seen 06/14/2019. On leflunomide for MS.    HISTORY OF PRESENT ILLNESS:  [name] Is a 45 year old woman with relapsing remitting multiple sclerosis.    She was diagnosed with MS in 2008.    Update 07/18/2020: She is on leflunomide and she tolerates it well.  She denies any new symptoms or exacerbations.  Gait is baseline with no falls.  Sometimes balance is off and she veers some.   Her legs are mildly weak, left > right.   She gets some tingling in legs and feels weaker if she walks longer distance or is in the heat.   Sometimes the sensory symptoms come on randomly.   She has had frequent UTI's.   She has mild hesitancy but feels she empties.   She takes an antibiotic prophylactically at times.     She is seeing urology now.   Vision is correctable to 20/20.  Colors are symmetric.   She occasionally notes eye pain. She has RNFL thinning on OCT.     She has fatigue that onl partially improves with medication.      Armodafinil was better tolerated than Adderal IR (felt a rush then crash).  She has been prescribed phentermine but has not taken it due to mild HTN and difficulty tolerating Adderall in the past.  She sleeps well most nights.    She has depression and is on fluoxetine with some beneft.    She is also seeing a therapist.   Anxiety is more of a problem this year.    She is noting more cognitive issues and had formal neurocognitive testing in the past.   She had reduced executive functioning.   She is having more trouble figuring out how to get from point A to B in her car.   She has trouble with sensory overload at times.   Reduced verbal fluency    She  took high dose Vit D bit has not taken OTC supplements since.     MS History:     In 2008, she presented with left-sided numbness and weakness. She also difficulty with her gait. An MRI of the brain was consistent with multiple sclerosis. She was seeing Dr. Brett Fairy at the time.   =Initially, she was placed on Copaxone after she was diagnosed in 2008 but stopped after a year due to MRI changes.   Then, she was placed on Betaseron but she had an exacerbation and was referred to me. We started  Tysabri in 2012. She did well on Tysabri but was high titer JCV antibody positive and stopped after a year or so.  She was started on Gilenya but stopped due to shortness of breath.  She then went on Tecfidera. She had difficulty tolerating it.    She had left greater than right arm numbness 07/05/2012 and received 3 days of IV Solu-Medrol. Of note, she had stopped Tecfidera 2 months earlier because of GI issues. MRI of the cervical spine in 2011 showed a focus adjacent to C3. An MRI performed May 2014 showed 3 enhancing lesions.   She switched to Aubagio.    An MRI of the brain 07/20/2013 at [facility] was unchanged compared to a  previous one from 07/10/2012.   She had an exacerbation 12/20/2013 with right optic neuritis and received several days of IV Solu-Medrol.   Due to expensive copay, se switched to leflunomide.         Imaging: MRI of the brain 09/09/2018 showed T2/flair hyperintense foci in the hemispheres and the upper cervical spinal cord in a pattern and configuration consistent with chronic demyelinating plaque associated with multiple sclerosis.  None of the foci appears to be acute.  However, 3 small foci in the right frontal lobe and the left external capsule present on the current MRI were not present on the 2018 MRI.   There is a normal enhancement pattern and no new findings.  MRI of the cervical spine 09/09/2018 showed patchy foci within the upper cervical spine (C2, C2-C3 and C3) consistent with  chronic demyelinating plaque associated with multiple sclerosis.  None of the foci appears to be acute.  There is no recent cervical spine MRI for comparison though 2 foci were noted in the upper cervical spine on the 2018 brain MRI.  One of these was present on the 2011 cervical spine MRI.      No significant degenerative changes.  No nerve root compression or spinal stenosis.     REVIEW OF SYSTEMS: Constitutional: No fevers, chills, sweats, or change in appetite.   She has fatigue, daytime sleepiness and nighttime insomnia Eyes: No visual changes, double vision, eye pain Ear, nose and throat: No hearing loss, ear pain, nasal congestion, sore throat Cardiovascular: No chest pain, palpitations Respiratory: No shortness of breath at rest or with exertion.   No wheezes GastrointestinaI: No nausea, vomiting, diarrhea, abdominal pain, fecal incontinence Genitourinary: No dysuria, urinary retention or frequency.  No nocturia. Musculoskeletal: No neck pain, back pain Integumentary: No rash, pruritus, skin lesions Neurological: as above Psychiatric: As above Endocrine: No palpitations, diaphoresis, change in appetite, change in weigh or increased thirst Hematologic/Lymphatic: No anemia, purpura, petechiae. Allergic/Immunologic: No itchy/runny eyes, nasal congestion, recent allergic reactions, rashes  ALLERGIES: No Known Allergies  HOME MEDICATIONS:  Current Outpatient Medications:  .  ALPRAZolam (XANAX) 1 MG tablet, Take 1 tablet (1 mg total) by mouth at bedtime as needed for anxiety., Disp: 30 tablet, Rfl: 5 .  amLODipine (NORVASC) 5 MG tablet, Take 5 mg by mouth daily., Disp: , Rfl:  .  Armodafinil 150 MG tablet, Take 1/2 to 1 pill po qAM, Disp: 30 tablet, Rfl: 5 .  baclofen (LIORESAL) 10 MG tablet, Take 10 mg by mouth 3 (three) times daily as needed for muscle spasms., Disp: , Rfl:  .  cephALEXin (KEFLEX) 500 MG capsule, Take 1 capsule by mouth 2 (two) times daily., Disp: , Rfl:  .   leflunomide (ARAVA) 20 MG tablet, Take 1 tablet (20 mg total) by mouth daily., Disp: 90 tablet, Rfl: 3 .  TURMERIC-GINGER PO, Take 1 capsule by mouth daily., Disp: , Rfl:  .  valACYclovir (VALTREX) 500 MG tablet, Take one tablet twice daily as needed for cold sores., Disp: 30 tablet, Rfl: 0 .  Vitamin D-Vitamin K (VITAMIN K2-VITAMIN D3 PO), Take 1 capsule by mouth daily., Disp: , Rfl:  .  FLUoxetine (PROZAC) 20 MG capsule, Take 1 capsule by mouth daily., Disp: , Rfl:   PAST MEDICAL HISTORY: Past Medical History:  Diagnosis Date  . Anxiety   . Depression   . MS (multiple sclerosis) ([place])    Diagnosed in 2005  . MS (multiple sclerosis) ([place])     PAST SURGICAL HISTORY:  No past surgical history on file.  FAMILY HISTORY: Family History  Problem Relation Age of Onset  . Depression Mother     SOCIAL HISTORY:  Social History   Socioeconomic History  . Marital status: Divorced    Spouse name: Not on file  . Number of children: Not on file  . Years of education: Not on file  . Highest education level: Not on file  Occupational History  . Not on file  Tobacco Use  . Smoking status: Former Smoker    Types: Cigarettes  . Smokeless tobacco: Never Used  Substance and Sexual Activity  . Alcohol use: Yes    Alcohol/week: 0.0 standard drinks    Comment: occasional/fim  . Drug use: No  . Sexual activity: Not on file  Other Topics Concern  . Not on file  Social History Narrative  . Not on file   Social Determinants of Health   Financial Resource Strain: Not on file  Food Insecurity: Not on file  Transportation Needs: Not on file  Physical Activity: Not on file  Stress: Not on file  Social Connections: Not on file  Intimate Partner Violence: Not on file     PHYSICAL EXAM  Vitals:   07/18/20 1338  BP: 128/82  Pulse: 82  SpO2: 98%  Weight: 211 lb (95.7 kg)  Height: 5\' 3"  (1.6 m)    Body mass index is 37.38 kg/m.   General: The patient is well-developed and  well-nourished and in no acute distress.     Neurologic Exam  Mental status: The patient is alert and oriented x 3 at the time of the examination. The patient has apparent normal recent and remote memory, with an apparently normal attention span and concentration ability.   Speech with a few errors.  Cranial nerves: Extraocular movements are full.  Symm color vision Facial strength and sensation is normal.  Trapezius strength is normal the tongue is midline, and the patient has symmetric elevation of the soft palate. No obvious hearing deficits are noted.  Motor:  Muscle bulk is normal.   Tone is normal. Strength is  5 / 5 in all 4 extremities.   Sensory: Intact sensation to touch and vibration in the limbs.  Coordination: Cerebellar testing reveals good finger-nose-finger.  Gait and station: Station is normal.   Gait is mildly wide.   Tandem gait is wide.   The Romberg is negative.  Reflexes: Deep tendon reflexes are symmetric.   Leg DTRs are brisk with spread at the knees but no ankle clonus.  .      ASSESSMENT AND PLAN  Multiple sclerosis (HCC)  Chronic fatigue  History of optic neuritis  Multiple joint pain  Hypersomnia, idiopathic  Restless leg   1.   For MS, continue leflunomide.  Check labs 2.   Nuvigil for excessive daytime sleepiness and fatigue.Marland Kitchen  Also check TSH 3.  Stay active and exercise as tolerated. 4.   If RLS worsens we will consider treatment.   Check ferritin 5.   She will return to see me in 6 months if stable or call sooner if she is having new or worsening neurologic symptoms.  45-minute office visit with the majority of the time spent face-to-face for history and physical, discussion/counseling and decision-making.  Additional time with record review and documentation.   Bleu Minerd A. Felecia Shelling, MD, PhD 05/18/9627, 5:28 PM Certified in Neurology, Clinical Neurophysiology, Sleep Medicine, Pain Medicine and Neuroimaging  [facility] [address]  [place], [place] [address] [phone]

## 2020-07-18 NOTE — Telephone Encounter (Signed)
MRI brain w/wo contrast, GI obtains auth  Sent to GI for scheduling

## 2020-07-19 ENCOUNTER — Telehealth: Payer: Self-pay | Admitting: *Deleted

## 2020-07-19 LAB — COMPREHENSIVE METABOLIC PANEL
ALT: 24 IU/L (ref 0–32)
AST: 16 IU/L (ref 0–40)
Albumin/Globulin Ratio: 1.7 (ref 1.2–2.2)
Albumin: 4.3 g/dL (ref 3.8–4.8)
Alkaline Phosphatase: 79 IU/L (ref 44–121)
BUN/Creatinine Ratio: 15 (ref 9–23)
BUN: 11 mg/dL (ref 6–24)
Bilirubin Total: 0.4 mg/dL (ref 0.0–1.2)
CO2: 23 mmol/L (ref 20–29)
Calcium: 9.2 mg/dL (ref 8.7–10.2)
Chloride: 103 mmol/L (ref 96–106)
Creatinine, Ser: 0.73 mg/dL (ref 0.57–1.00)
Globulin, Total: 2.6 g/dL (ref 1.5–4.5)
Glucose: 115 mg/dL — ABNORMAL HIGH (ref 65–99)
Potassium: 4 mmol/L (ref 3.5–5.2)
Sodium: 141 mmol/L (ref 134–144)
Total Protein: 6.9 g/dL (ref 6.0–8.5)
eGFR: 105 mL/min/{1.73_m2} (ref >59)

## 2020-07-19 LAB — CBC WITH DIFFERENTIAL/PLATELET
Basophils Absolute: 0 10*3/uL (ref 0.0–0.2)
Basos: 0 %
EOS (ABSOLUTE): 0.2 10*3/uL (ref 0.0–0.4)
Eos: 3 %
Hematocrit: 41 % (ref 34.0–46.6)
Hemoglobin: 14 g/dL (ref 11.1–15.9)
Immature Grans (Abs): 0 10*3/uL (ref 0.0–0.1)
Immature Granulocytes: 0 %
Lymphocytes Absolute: 1.5 10*3/uL (ref 0.7–3.1)
Lymphs: 27 %
MCH: 32.5 pg (ref 26.6–33.0)
MCHC: 34.1 g/dL (ref 31.5–35.7)
MCV: 95 fL (ref 79–97)
Monocytes Absolute: 0.5 10*3/uL (ref 0.1–0.9)
Monocytes: 9 %
Neutrophils Absolute: 3.5 10*3/uL (ref 1.4–7.0)
Neutrophils: 61 %
Platelets: 219 10*3/uL (ref 150–450)
RBC: 4.31 x10E6/uL (ref 3.77–5.28)
RDW: 11.8 % (ref 11.7–15.4)
WBC: 5.7 10*3/uL (ref 3.4–10.8)

## 2020-07-19 LAB — FERRITIN: Ferritin: 235 ng/mL — ABNORMAL HIGH (ref 15–150)

## 2020-07-19 LAB — TSH: TSH: 1.19 u[IU]/mL (ref 0.450–4.500)

## 2020-07-19 NOTE — Telephone Encounter (Signed)
Called and spoke w/ pt about results per Dr. Sater's note. Pt verbalized understanding.  

## 2020-07-19 NOTE — Telephone Encounter (Signed)
-----   Message from Britt Bottom, MD sent at 07/19/2020  1:13 PM EDT ----- Please let the patient know that the lab work is fine.

## 2020-07-27 DIAGNOSIS — M25561 Pain in right knee: Secondary | ICD-10-CM | POA: Diagnosis not present

## 2020-08-02 DIAGNOSIS — M25561 Pain in right knee: Secondary | ICD-10-CM | POA: Diagnosis not present

## 2020-08-07 DIAGNOSIS — L723 Sebaceous cyst: Secondary | ICD-10-CM | POA: Diagnosis not present

## 2020-08-08 DIAGNOSIS — M25561 Pain in right knee: Secondary | ICD-10-CM | POA: Diagnosis not present

## 2020-08-09 ENCOUNTER — Telehealth: Payer: Self-pay | Admitting: *Deleted

## 2020-08-09 NOTE — Telephone Encounter (Signed)
Faxed completed/signed surgery clearance form back to [facility] for R knee scope on 08/30/20. Pt cleared neurologically. Fax: [phone]. Received fax confirmation.

## 2020-08-24 ENCOUNTER — Encounter (HOSPITAL_BASED_OUTPATIENT_CLINIC_OR_DEPARTMENT_OTHER): Payer: Self-pay | Admitting: Orthopaedic Surgery

## 2020-08-24 ENCOUNTER — Other Ambulatory Visit: Payer: Self-pay

## 2020-08-29 ENCOUNTER — Encounter (HOSPITAL_BASED_OUTPATIENT_CLINIC_OR_DEPARTMENT_OTHER)
Admission: RE | Admit: 2020-08-29 | Discharge: 2020-08-29 | Disposition: A | Discharge status: Home / Self Care | Payer: PPO | Source: Ambulatory Visit | Attending: Orthopaedic Surgery | Admitting: Orthopaedic Surgery

## 2020-08-29 DIAGNOSIS — Z01812 Encounter for preprocedural laboratory examination: Secondary | ICD-10-CM | POA: Diagnosis not present

## 2020-08-29 LAB — POCT PREGNANCY, URINE: Preg Test, Ur: NEGATIVE

## 2020-08-29 NOTE — H&P (Signed)
PREOPERATIVE H&P  Chief Complaint: right knee medial meniscus tear  HPI: [name] is a 45 y.o. female who is scheduled for, Procedure(s): KNEE ARTHROSCOPY WITH MEDIAL MENISECTOMY.   Patient has a past medical history significant for HTN and multiple sclerosis.   The patient is a 45 year old female who has had right knee pain for a few months, but it has worsened over the past month.  She notes she had a previous meniscus tear in her left knee.  She never had an MRI, but her previous orthopedist told her that her symptoms were consistent with this.  Her left knee has now healed, and she doesn't have any issues with it, but she notes her right knee feels similar to the left when she was having issues with the left side.  She feels like she is walking differently.  She is unable to fully extend her right knee.  She likes to keep it in a flexed position.  She is very apprehensive when she is walking.  She has tried using a brace at home and antiinflammatories, but this is not improving, and due to the worsening in her symptoms she wanted to be seen today.    Symptoms are rated as moderate to severe, and have been worsening.  This is significantly impairing activities of daily living.    Please see clinic note for further details on this patient's care.    She has elected for surgical management.   Past Medical History:  Diagnosis Date   Anxiety    Depression    Hypertension    MS (multiple sclerosis) ([place])    Diagnosed in 2005   MS (multiple sclerosis) ([place])    Past Surgical History:  Procedure Laterality Date   MOUTH SURGERY     WISDOM TOOTH EXTRACTION     Social History   Socioeconomic History   Marital status: Divorced    Spouse name: Not on file   Number of children: Not on file   Years of education: Not on file   Highest education level: Not on file  Occupational History   Not on file  Tobacco Use   Smoking status: Former    Types: Cigarettes   Smokeless  tobacco: Never  Vaping Use   Vaping Use: Never used  Substance and Sexual Activity   Alcohol use: Yes    Alcohol/week: 0.0 standard drinks    Comment: occasional/fim   Drug use: No   Sexual activity: Not on file  Other Topics Concern   Not on file  Social History Narrative   Not on file   Social Determinants of Health   Financial Resource Strain: Not on file  Food Insecurity: Not on file  Transportation Needs: Not on file  Physical Activity: Not on file  Stress: Not on file  Social Connections: Not on file   Family History  Problem Relation Age of Onset   Depression Mother    No Known Allergies Prior to Admission medications   Medication Sig Start Date End Date Taking? Authorizing Provider  amLODipine (NORVASC) 5 MG tablet Take 5 mg by mouth daily.   Yes [provider]  cephALEXin (KEFLEX) 500 MG capsule Take 1 capsule by mouth 2 (two) times daily. 06/14/20  Yes [provider]  FLUoxetine (PROZAC) 20 MG capsule Take 1 capsule by mouth daily. 06/05/20  Yes [provider]  leflunomide (ARAVA) 20 MG tablet Take 1 tablet (20 mg total) by mouth daily. 06/14/19  Yes Sater,  Nanine Means, MD  levonorgestrel (MIRENA) 20 MCG/DAY IUD 1 each by Intrauterine route once.   Yes [provider]  TURMERIC-GINGER PO Take 1 capsule by mouth daily.   Yes [provider]  valACYclovir (VALTREX) 500 MG tablet Take one tablet twice daily as needed for cold sores. 08/08/16  Yes Sater, Nanine Means, MD  Vitamin D-Vitamin K (VITAMIN K2-VITAMIN D3 PO) Take 1 capsule by mouth daily.   Yes [provider]  ALPRAZolam Duanne Moron) 1 MG tablet Take 1 tablet (1 mg total) by mouth at bedtime as needed for anxiety. 06/14/19   Sater, Nanine Means, MD  baclofen (LIORESAL) 10 MG tablet Take 10 mg by mouth 3 (three) times daily as needed for muscle spasms.    [provider]    ROS: All other systems have been reviewed and were otherwise negative with the exception of  those mentioned in the HPI and as above.  Physical Exam: General: Alert, no acute distress Cardiovascular: No pedal edema Respiratory: No cyanosis, no use of accessory musculature GI: No organomegaly, abdomen is soft and non-tender Skin: No lesions in the area of chief complaint Neurologic: Sensation intact distally Psychiatric: Patient is competent for consent with normal mood and affect Lymphatic: No axillary or cervical lymphadenopathy  MUSCULOSKELETAL:  Right knee: Positive McMurray.  Tender to palpation at the medial joint.  No ligamentous instability.    Imaging: MRI demonstrates a complex horizontal meniscus tear with a parameniscal cyst that is about two centimeters in size in the posterior aspect of the knee.    Assessment: right knee medial meniscus tear  Plan: Plan for Procedure(s): KNEE ARTHROSCOPY WITH MEDIAL MENISECTOMY  The risks benefits and alternatives were discussed with the patient including but not limited to the risks of nonoperative treatment, versus surgical intervention including infection, bleeding, nerve injury,  blood clots, cardiopulmonary complications, morbidity, mortality, among others, and they were willing to proceed.   The patient acknowledged the explanation, agreed to proceed with the plan and consent was signed.   Operative Plan: Right knee scope with partial medial meniscectomy with cyst decompression with a shaver Discharge Medications: Standard DVT Prophylaxis: Aspirin Physical Therapy: Outpatient PT Special Discharge needs: +/-   Ethelda Chick, PA-C  08/29/2020 6:55 AM

## 2020-08-29 NOTE — Progress Notes (Signed)

## 2020-08-30 ENCOUNTER — Encounter (HOSPITAL_BASED_OUTPATIENT_CLINIC_OR_DEPARTMENT_OTHER): Payer: Self-pay | Admitting: Orthopaedic Surgery

## 2020-08-30 ENCOUNTER — Other Ambulatory Visit: Payer: Self-pay

## 2020-08-30 ENCOUNTER — Encounter (HOSPITAL_BASED_OUTPATIENT_CLINIC_OR_DEPARTMENT_OTHER)
Admission: RE | Disposition: A | Discharge status: Home / Self Care | Payer: Self-pay | Source: Home / Self Care | Attending: Orthopaedic Surgery

## 2020-08-30 ENCOUNTER — Ambulatory Visit (HOSPITAL_BASED_OUTPATIENT_CLINIC_OR_DEPARTMENT_OTHER): Payer: PPO | Admitting: Certified Registered"

## 2020-08-30 ENCOUNTER — Ambulatory Visit (HOSPITAL_BASED_OUTPATIENT_CLINIC_OR_DEPARTMENT_OTHER)
Admission: RE | Admit: 2020-08-30 | Discharge: 2020-08-30 | Disposition: A | Discharge status: Home / Self Care | Payer: PPO | Attending: Orthopaedic Surgery | Admitting: Orthopaedic Surgery

## 2020-08-30 DIAGNOSIS — S83231A Complex tear of medial meniscus, current injury, right knee, initial encounter: Secondary | ICD-10-CM | POA: Diagnosis not present

## 2020-08-30 DIAGNOSIS — I10 Essential (primary) hypertension: Secondary | ICD-10-CM | POA: Insufficient documentation

## 2020-08-30 DIAGNOSIS — M23003 Cystic meniscus, unspecified medial meniscus, right knee: Secondary | ICD-10-CM | POA: Diagnosis not present

## 2020-08-30 DIAGNOSIS — X58XXXA Exposure to other specified factors, initial encounter: Secondary | ICD-10-CM | POA: Diagnosis not present

## 2020-08-30 DIAGNOSIS — S83241A Other tear of medial meniscus, current injury, right knee, initial encounter: Secondary | ICD-10-CM | POA: Diagnosis not present

## 2020-08-30 DIAGNOSIS — Z793 Long term (current) use of hormonal contraceptives: Secondary | ICD-10-CM | POA: Diagnosis not present

## 2020-08-30 DIAGNOSIS — G35 Multiple sclerosis: Secondary | ICD-10-CM | POA: Diagnosis not present

## 2020-08-30 DIAGNOSIS — Z79899 Other long term (current) drug therapy: Secondary | ICD-10-CM | POA: Diagnosis not present

## 2020-08-30 HISTORY — PX: KNEE ARTHROSCOPY WITH MEDIAL MENISECTOMY: SHX5651

## 2020-08-30 HISTORY — DX: Essential (primary) hypertension: I10

## 2020-08-30 SURGERY — ARTHROSCOPY, KNEE, WITH MEDIAL MENISCECTOMY
Anesthesia: General | Site: Knee | Laterality: Right

## 2020-08-30 MED ORDER — LIDOCAINE HCL (PF) 2 % IJ SOLN
INTRAMUSCULAR | Status: AC
  Filled 2020-08-30: qty 5

## 2020-08-30 MED ORDER — OXYCODONE HCL 5 MG/5ML PO SOLN: 5.0000 mg | Freq: Once | ORAL | Status: AC | PRN

## 2020-08-30 MED ORDER — OXYCODONE HCL 5 MG PO TABS
5.0000 mg | ORAL_TABLET | Freq: Once | ORAL | Status: AC | PRN
  Administered 2020-08-30: 5 mg via ORAL

## 2020-08-30 MED ORDER — LACTATED RINGERS IV SOLN: INTRAVENOUS | Status: DC

## 2020-08-30 MED ORDER — BUPIVACAINE HCL (PF) 0.25 % IJ SOLN
INTRAMUSCULAR | Status: DC | PRN
  Administered 2020-08-30: 20 mL

## 2020-08-30 MED ORDER — EPHEDRINE SULFATE 50 MG/ML IJ SOLN
INTRAMUSCULAR | Status: DC | PRN
  Administered 2020-08-30: 10 mg via INTRAVENOUS

## 2020-08-30 MED ORDER — ONDANSETRON HCL 4 MG/2ML IJ SOLN
INTRAMUSCULAR | Status: AC
  Filled 2020-08-30: qty 2

## 2020-08-30 MED ORDER — CEFAZOLIN SODIUM-DEXTROSE 2-4 GM/100ML-% IV SOLN
2.0000 g | INTRAVENOUS | Status: AC
  Administered 2020-08-30: 2 g via INTRAVENOUS

## 2020-08-30 MED ORDER — OXYCODONE HCL 5 MG PO TABS
ORAL_TABLET | ORAL | Status: AC
  Filled 2020-08-30: qty 1

## 2020-08-30 MED ORDER — DEXAMETHASONE SODIUM PHOSPHATE 4 MG/ML IJ SOLN
INTRAMUSCULAR | Status: DC | PRN
  Administered 2020-08-30: 10 mg via INTRAVENOUS

## 2020-08-30 MED ORDER — PROPOFOL 10 MG/ML IV BOLUS
INTRAVENOUS | Status: AC
  Filled 2020-08-30: qty 20

## 2020-08-30 MED ORDER — KETOROLAC TROMETHAMINE 30 MG/ML IJ SOLN
INTRAMUSCULAR | Status: AC
  Filled 2020-08-30: qty 1

## 2020-08-30 MED ORDER — FENTANYL CITRATE (PF) 100 MCG/2ML IJ SOLN
INTRAMUSCULAR | Status: DC | PRN
  Administered 2020-08-30: 50 ug via INTRAVENOUS
  Administered 2020-08-30 (×2): 25 ug via INTRAVENOUS

## 2020-08-30 MED ORDER — PROPOFOL 10 MG/ML IV BOLUS
INTRAVENOUS | Status: DC | PRN
  Administered 2020-08-30: 200 mg via INTRAVENOUS
  Administered 2020-08-30: 50 mg via INTRAVENOUS

## 2020-08-30 MED ORDER — EPHEDRINE 5 MG/ML INJ
INTRAVENOUS | Status: AC
  Filled 2020-08-30: qty 10

## 2020-08-30 MED ORDER — ONDANSETRON HCL 4 MG/2ML IJ SOLN: 4.0000 mg | Freq: Once | INTRAMUSCULAR | Status: DC | PRN

## 2020-08-30 MED ORDER — ONDANSETRON HCL 4 MG PO TABS: 4.0000 mg | ORAL_TABLET | Freq: Three times a day (TID) | ORAL | 0 refills | Status: AC | PRN

## 2020-08-30 MED ORDER — SODIUM CHLORIDE 0.9 % IR SOLN
Status: DC | PRN
  Administered 2020-08-30: 3000 mL

## 2020-08-30 MED ORDER — FENTANYL CITRATE (PF) 100 MCG/2ML IJ SOLN
INTRAMUSCULAR | Status: AC
  Filled 2020-08-30: qty 2

## 2020-08-30 MED ORDER — MIDAZOLAM HCL 5 MG/5ML IJ SOLN
INTRAMUSCULAR | Status: DC | PRN
  Administered 2020-08-30: 2 mg via INTRAVENOUS

## 2020-08-30 MED ORDER — CEFAZOLIN SODIUM-DEXTROSE 2-4 GM/100ML-% IV SOLN
INTRAVENOUS | Status: AC
  Filled 2020-08-30: qty 100

## 2020-08-30 MED ORDER — BUPIVACAINE HCL (PF) 0.25 % IJ SOLN
INTRAMUSCULAR | Status: AC
  Filled 2020-08-30: qty 60

## 2020-08-30 MED ORDER — FENTANYL CITRATE (PF) 100 MCG/2ML IJ SOLN: 25.0000 ug | INTRAMUSCULAR | Status: DC | PRN

## 2020-08-30 MED ORDER — AMISULPRIDE (ANTIEMETIC) 5 MG/2ML IV SOLN: 10.0000 mg | Freq: Once | INTRAVENOUS | Status: DC | PRN

## 2020-08-30 MED ORDER — KETOROLAC TROMETHAMINE 30 MG/ML IJ SOLN
INTRAMUSCULAR | Status: DC | PRN
  Administered 2020-08-30: 30 mg via INTRAVENOUS

## 2020-08-30 MED ORDER — MIDAZOLAM HCL 2 MG/2ML IJ SOLN
INTRAMUSCULAR | Status: AC
  Filled 2020-08-30: qty 2

## 2020-08-30 MED ORDER — ACETAMINOPHEN 500 MG PO TABS: 1000.0000 mg | ORAL_TABLET | Freq: Three times a day (TID) | ORAL | 0 refills | Status: AC

## 2020-08-30 MED ORDER — ONDANSETRON HCL 4 MG/2ML IJ SOLN
INTRAMUSCULAR | Status: DC | PRN
  Administered 2020-08-30: 4 mg via INTRAVENOUS

## 2020-08-30 MED ORDER — BUPIVACAINE HCL (PF) 0.5 % IJ SOLN
INTRAMUSCULAR | Status: AC
  Filled 2020-08-30: qty 30

## 2020-08-30 MED ORDER — DEXAMETHASONE SODIUM PHOSPHATE 10 MG/ML IJ SOLN
INTRAMUSCULAR | Status: AC
  Filled 2020-08-30: qty 1

## 2020-08-30 MED ORDER — OXYCODONE HCL 5 MG PO TABS: ORAL_TABLET | ORAL | 0 refills | Status: AC

## 2020-08-30 MED ORDER — MELOXICAM 15 MG PO TABS: 15.0000 mg | ORAL_TABLET | Freq: Every day | ORAL | 0 refills | Status: DC

## 2020-08-30 MED ORDER — LIDOCAINE 2% (20 MG/ML) 5 ML SYRINGE
INTRAMUSCULAR | Status: DC | PRN
  Administered 2020-08-30: 100 mg via INTRAVENOUS

## 2020-08-30 MED ORDER — ASPIRIN 81 MG PO CHEW: 81.0000 mg | CHEWABLE_TABLET | Freq: Two times a day (BID) | ORAL | 0 refills | Status: AC

## 2020-08-30 SURGICAL SUPPLY — 38 items
APL PRP STRL LF DISP 70% ISPRP (MISCELLANEOUS) ×1
BANDAGE ESMARK 6X9 LF (GAUZE/BANDAGES/DRESSINGS) IMPLANT
BLADE CLIPPER SURG (BLADE) IMPLANT
BNDG CMPR 9X6 STRL LF SNTH (GAUZE/BANDAGES/DRESSINGS)
BNDG ELASTIC 6X5.8 VLCR STR LF (GAUZE/BANDAGES/DRESSINGS) ×2 IMPLANT
BNDG ESMARK 6X9 LF (GAUZE/BANDAGES/DRESSINGS)
CHLORAPREP W/TINT 26 (MISCELLANEOUS) ×2 IMPLANT
CLSR STERI-STRIP ANTIMIC 1/2X4 (GAUZE/BANDAGES/DRESSINGS) ×2 IMPLANT
CUFF TOURN SGL QUICK 34 (TOURNIQUET CUFF)
CUFF TRNQT CYL 34X4.125X (TOURNIQUET CUFF) ×1 IMPLANT
DISSECTOR 3.5MM X 13CM CVD (MISCELLANEOUS) IMPLANT
DISSECTOR 4.0MMX13CM CVD (MISCELLANEOUS) ×2 IMPLANT
DRAPE ARTHROSCOPY W/POUCH 90 (DRAPES) ×2 IMPLANT
DRAPE IMP U-DRAPE 54X76 (DRAPES) ×2 IMPLANT
DRAPE U-SHAPE 47X51 STRL (DRAPES) ×2 IMPLANT
GAUZE SPONGE 4X4 12PLY STRL (GAUZE/BANDAGES/DRESSINGS) ×2 IMPLANT
GLOVE SRG 8 PF TXTR STRL LF DI (GLOVE) ×1 IMPLANT
GLOVE SURG ENC MOIS LTX SZ6.5 (GLOVE) ×3 IMPLANT
GLOVE SURG LTX SZ8 (GLOVE) ×4 IMPLANT
GLOVE SURG UNDER POLY LF SZ6.5 (GLOVE) ×3 IMPLANT
GLOVE SURG UNDER POLY LF SZ8 (GLOVE) ×2
GOWN STRL REUS W/ TWL LRG LVL3 (GOWN DISPOSABLE) ×1 IMPLANT
GOWN STRL REUS W/TWL LRG LVL3 (GOWN DISPOSABLE) ×4
GOWN STRL REUS W/TWL XL LVL3 (GOWN DISPOSABLE) ×2 IMPLANT
KIT TURNOVER KIT B (KITS) ×2 IMPLANT
MANIFOLD NEPTUNE II (INSTRUMENTS) ×1 IMPLANT
NDL SAFETY ECLIPSE 18X1.5 (NEEDLE) ×1 IMPLANT
NEEDLE HYPO 18GX1.5 SHARP (NEEDLE) ×2
NS IRRIG 1000ML POUR BTL (IV SOLUTION) IMPLANT
PACK ARTHROSCOPY DSU (CUSTOM PROCEDURE TRAY) ×2 IMPLANT
PORT APPOLLO RF 90DEGREE MULTI (SURGICAL WAND) IMPLANT
SLEEVE SCD COMPRESS KNEE MED (STOCKING) ×2 IMPLANT
SUT MNCRL AB 4-0 PS2 18 (SUTURE) ×2 IMPLANT
SYR 5ML LUER SLIP (SYRINGE) ×2 IMPLANT
TOWEL GREEN STERILE FF (TOWEL DISPOSABLE) ×2 IMPLANT
TUBE CONNECTING 20X1/4 (TUBING) ×2 IMPLANT
TUBING ARTHROSCOPY IRRIG 16FT (MISCELLANEOUS) ×2 IMPLANT
WATER STERILE IRR 1000ML POUR (IV SOLUTION) ×2 IMPLANT

## 2020-08-30 NOTE — Anesthesia Preprocedure Evaluation (Signed)
Anesthesia Evaluation  Patient identified by MRN, date of birth, ID band Patient awake    Reviewed: Allergy & Precautions, NPO status , Patient's Chart, lab work & pertinent test results  History of Anesthesia Complications Negative for: history of anesthetic complications  Airway Mallampati: I  TM Distance: >3 FB Neck ROM: Full    Dental  (+) Dental Advisory Given   Pulmonary neg pulmonary ROS, former smoker,    Pulmonary exam normal        Cardiovascular hypertension, Pt. on medications Normal cardiovascular exam     Neuro/Psych Anxiety Depression MS negative neurological ROS     GI/Hepatic negative GI ROS, Neg liver ROS,   Endo/Other  negative endocrine ROS  Renal/GU negative Renal ROS  negative genitourinary   Musculoskeletal negative musculoskeletal ROS (+)   Abdominal   Peds  Hematology negative hematology ROS (+)   Anesthesia Other Findings   Reproductive/Obstetrics negative OB ROS                           Anesthesia Physical Anesthesia Plan  ASA: 2  Anesthesia Plan: General   Post-op Pain Management:    Induction: Intravenous  PONV Risk Score and Plan: 3 and Ondansetron, Dexamethasone, Midazolam and Treatment may vary due to age or medical condition  Airway Management Planned: LMA  Additional Equipment: None  Intra-op Plan:   Post-operative Plan: Extubation in OR  Informed Consent: I have reviewed the patients History and Physical, chart, labs and discussed the procedure including the risks, benefits and alternatives for the proposed anesthesia with the patient or authorized representative who has indicated his/her understanding and acceptance.     Dental advisory given  Plan Discussed with:   Anesthesia Plan Comments:         Anesthesia Quick Evaluation

## 2020-08-30 NOTE — Anesthesia Postprocedure Evaluation (Signed)
Anesthesia Post Note  Patient: [name]  Procedure(s) Performed: KNEE ARTHROSCOPY WITH MEDIAL MENISECTOMY WITH CYST DECOMPRESSION (Right: Knee)     Patient location during evaluation: PACU Anesthesia Type: General Level of consciousness: awake and alert Pain management: pain level controlled Vital Signs Assessment: post-procedure vital signs reviewed and stable Respiratory status: spontaneous breathing, nonlabored ventilation and respiratory function stable Cardiovascular status: blood pressure returned to baseline and stable Postop Assessment: no apparent nausea or vomiting Anesthetic complications: no   No notable events documented.  Last Vitals:  Vitals:   08/30/20 1030 08/30/20 1107  BP: (!) 148/95 (!) 137/94  Pulse: (!) 58 65  Resp: 16 17  Temp:  36.6 C  SpO2: 97% 98%    Last Pain:  Vitals:   08/30/20 1121  TempSrc:   PainSc: 3                  Lidia Collum

## 2020-08-30 NOTE — Op Note (Signed)
Orthopaedic Surgery Operative Note (CSN: [id number])  [name]  1975-04-22 Date of Surgery: 08/30/2020   Diagnoses:  Right knee medial meniscus tear with parameniscal cyst  Procedure: Right knee partial medial meniscectomy with parameniscal cyst decompression   Operative Finding Exam under anesthesia: Full motion no limitation no instability Suprapatellar pouch: Normal Patellofemoral Compartment: Normal Medial Compartment: Complex horizontal tear primarily of the medial meniscus.  Undersurface leaflet was removed and we were able to bluntly decompressed the parameniscal cyst leaving a open hole to prevent a one-way valve.  20% total meniscal volume was resected but the undersurface leaflet primarily Lateral Compartment: Normal Intercondylar Notch: Normal  Successful completion of the planned procedure.    Post-operative plan: The patient will be weightbearing to tolerance.  The patient will be discharged home.  DVT prophylaxis Aspirin 81 mg twice daily for 6 weeks.  Pain control with PRN pain medication preferring oral medicines.  Follow up plan will be scheduled in approximately 7 days for incision check and XR.  Post-Op Diagnosis: Same Surgeons:Primary: Hiram Gash, MD Assistants:Caroline McBane PA-C Location: [facility] OR ROOM 6 Anesthesia: General with local anesthetic Antibiotics: Ancef 2 g Tourniquet time: * No tourniquets in log * Estimated Blood Loss: Minimal Complications: None Specimens: None Implants: * No implants in log *  Indications for Surgery:   Jillian FERRENTINO is a 45 y.o. female with medial meniscus tear and mechanical symptoms as well as mass-effect from the parameniscal cyst potentially.  Benefits and risks of operative and nonoperative management were discussed prior to surgery with patient/guardian(s) and informed consent form was completed.  Specific risks including infection, need for additional surgery, continued pain, post meniscectomy syndrome,  postoperative arthrosis amongst others.   Procedure:   The patient was identified properly. Informed consent was obtained and the surgical site was marked. The patient was taken up to suite where general anesthesia was induced. The patient was placed in the supine position with a post against the surgical leg and a nonsterile tourniquet applied. The surgical leg was then prepped and draped usual sterile fashion.  A standard surgical timeout was performed.  2 standard anterior portals were made and diagnostic arthroscopy performed. Please note the findings as noted above.  We cleared the joint as above.  We performed a partial medial meniscectomy with a shaver and a basket back to a stable base take the undersurface leaflet primarily.  We are able to identify and I decompressed the parameniscal cyst from the intra-articular surface.  We made an opening large enough to allow E flux of any cyst fluid into the joint.  Incisions closed with absorbable suture. The patient was awoken from general anesthesia and taken to the PACU in stable condition without complication.   Noemi Chapel, PA-C, present and scrubbed throughout the case, critical for completion in a timely fashion, and for retraction, instrumentation, closure.

## 2020-08-30 NOTE — Transfer of Care (Signed)
Immediate Anesthesia Transfer of Care Note  Patient: [name]  Procedure(s) Performed: KNEE ARTHROSCOPY WITH MEDIAL MENISECTOMY WITH CYST DECOMPRESSION (Right: Knee)  Patient Location: PACU  Anesthesia Type:General  Level of Consciousness: drowsy  Airway & Oxygen Therapy: Patient Spontanous Breathing and Patient connected to face mask oxygen  Post-op Assessment: Report given to RN, Post -op Vital signs reviewed and stable and Patient moving all extremities X 4  Post vital signs: Reviewed and stable  Last Vitals:  Vitals Value Taken Time  BP 122/78 08/30/20 1001  Temp    Pulse 77 08/30/20 1002  Resp 17 08/30/20 1002  SpO2 100 % 08/30/20 1002  Vitals shown include unvalidated device data.  Last Pain:  Vitals:   08/30/20 0749  TempSrc: Oral  PainSc: 0-No pain         Complications: No notable events documented.

## 2020-08-30 NOTE — Discharge Instructions (Addendum)
Ophelia Charter MD, MPH Noemi Chapel, PA-C [place] [address] [phone] (tel)   [phone] (fax)   POST-OPERATIVE INSTRUCTIONS - Knee Arthroscopy  WOUND CARE - You may remove the Operative Dressing on Post-Op Day #3 (72hrs after surgery).   -  Alternatively if you would like you can leave dressing on until follow-up if within 7-8 days but keep it dry. - Leave steri-strips in place until they fall off on their own, usually 2 weeks postop. - An ACE wrap may be used to control swelling, do not wrap this too tight.  If the initial ACE wrap feels too tight you may loosen it. - There may be a small amount of fluid/bleeding leaking at the surgical site.  - This is normal; the knee is filled with fluid during the procedure and can leak for 24-48hrs after surgery. You may change/reinforce the bandage as needed.  - Use the Cryocuff or Ice as often as possible for the first 7 days, then as needed for pain relief. Always keep a towel, ACE wrap or other barrier between the cooling unit and your skin.  - You may shower on Post-Op Day #3. Gently pat the area dry. Do not soak the knee in water or submerge it.  - Do not go swimming in the pool or ocean until 4 weeks after surgery or when otherwise instructed.  Keep incisions as dry as possible.   BRACE/AMBULATION -  You will not need a brace after this procedure.   - You can use crutches initially to help you ambulate, but this is not required - You CAN put full weight on your operative leg as you feel comfortable  REGIONAL ANESTHESIA (NERVE BLOCKS) - The anesthesia team may have performed a nerve block for you if safe in the setting of your care.  This is a great tool used to minimize pain.  Typically the block may start wearing off overnight.  This can be a challenging period but please utilize your as needed pain medications to try and manage this period and know it will be a brief transition as the nerve  block wears completely   POST-OP MEDICATIONS - Multimodal approach to pain control - In general your pain will be controlled with a combination of substances.  Prescriptions unless otherwise discussed are electronically sent to your pharmacy.  This is a carefully made plan we use to minimize narcotic use.     - Meloxicam - Anti-inflammatory medication taken on a scheduled basis - Acetaminophen - Non-narcotic pain medicine taken on a scheduled basis  - Oxycodone - This is a strong narcotic, to be used only on an "as needed" basis for pain. - Aspirin 81mg  - This medicine is used to minimize the risk of blood clots after surgery. -  Zofran - take as needed for nausea   FOLLOW-UP   Please call the office to schedule a follow-up appointment for your incision check, 7-10 days post-operatively.   IF YOU HAVE ANY QUESTIONS, PLEASE FEEL FREE TO CALL OUR OFFICE.   HELPFUL INFORMATION  - If you had a block, it will wear off between 8-24 hrs postop typically.  This is period when your pain may go from nearly zero to the pain you would have had post-op without the block.  This is an abrupt transition but nothing dangerous is happening.  You may take an extra dose of narcotic when this happens.   Keep your leg elevated to decrease swelling, which  will then in turn decrease your pain. I would elevate the foot of your bed by putting a couple of couch pillows between your mattress and box spring. I would not keep pillow directly under your ankle.  - Do not sleep with a pillow behind your knee even if it is more comfortable as this may make it harder to get your knee fully straight long term.   There will be MORE swelling on days 1-3 than there is on the day of surgery.  This also is normal. The swelling will decrease with the anti-inflammatory medication, ice and keeping it elevated. The swelling will make it more difficult to bend your knee. As the swelling goes down your motion will become  easier   You may develop swelling and bruising that extends from your knee down to your calf and perhaps even to your foot over the next week. Do not be alarmed. This too is normal, and it is due to gravity   There may be some numbness adjacent to the incision site. This may last for 6-12 months or longer in some patients and is expected.   You may return to sedentary work/school in the next couple of days when you feel up to it. You will need to keep your leg elevated as much as possible    You should wean off your narcotic medicines as soon as you are able.  Most patients will be off or using minimal narcotics before their first postop appointment.    We suggest you use the pain medication the first night prior to going to bed, in order to ease any pain when the anesthesia wears off. You should avoid taking pain medications on an empty stomach as it will make you nauseous.   Do not drink alcoholic beverages or take illicit drugs when taking pain medications.   It is against the law to drive while taking narcotics. You cannot drive if your Right leg is in brace locked in extension.   Pain medication may make you constipated.  Below are a few solutions to try in this order:  o Decrease the amount of pain medication if you aren't having pain.  o Drink lots of decaffeinated fluids.  o Drink prune juice and/or eat dried prunes   o If the first 3 don't work start with additional solutions  o Take Colace - an over-the-counter stool softener  o Take Senokot - an over-the-counter laxative  o Take Miralax - a stronger over-the-counter laxative    For more information including helpful videos and documents visit our website:   [email or web address]   Post Anesthesia Home Care Instructions  Activity: Get plenty of rest for the remainder of the day. A responsible individual must stay with you for 24 hours following the procedure.  For the next 24  hours, DO NOT: -Drive a car -Paediatric nurse -Drink alcoholic beverages -Take any medication unless instructed by your physician -Make any legal decisions or sign important papers.  Meals: Start with liquid foods such as gelatin or soup. Progress to regular foods as tolerated. Avoid greasy, spicy, heavy foods. If nausea and/or vomiting occur, drink only clear liquids until the nausea and/or vomiting subsides. Call your physician if vomiting continues.  Special Instructions/Symptoms: Your throat may feel dry or sore from the anesthesia or the breathing tube placed in your throat during surgery. If this causes discomfort, gargle with warm salt water. The discomfort should disappear within 24 hours.  If you had a scopolamine patch placed  behind your ear for the management of post- operative nausea and/or vomiting:  1. The medication in the patch is effective for 72 hours, after which it should be removed.  Wrap patch in a tissue and discard in the trash. Wash hands thoroughly with soap and water. 2. You may remove the patch earlier than 72 hours if you experience unpleasant side effects which may include dry mouth, dizziness or visual disturbances. 3. Avoid touching the patch. Wash your hands with soap and water after contact with the patch.

## 2020-08-30 NOTE — Interval H&P Note (Signed)
All questions answered

## 2020-08-30 NOTE — Anesthesia Procedure Notes (Signed)
Procedure Name: LMA Insertion Date/Time: 08/30/2020 9:26 AM Performed by: Ezequiel Kayser, CRNA Pre-anesthesia Checklist: Patient identified, Emergency Drugs available, Suction available and Patient being monitored Patient Re-evaluated:Patient Re-evaluated prior to induction Oxygen Delivery Method: Circle System Utilized Preoxygenation: Pre-oxygenation with 100% oxygen Induction Type: IV induction Ventilation: Mask ventilation without difficulty LMA: LMA inserted LMA Size: 4.0 Number of attempts: 1 Airway Equipment and Method: Bite block Placement Confirmation: positive ETCO2 Tube secured with: Tape Dental Injury: Teeth and Oropharynx as per pre-operative assessment

## 2020-08-31 ENCOUNTER — Encounter (HOSPITAL_BASED_OUTPATIENT_CLINIC_OR_DEPARTMENT_OTHER): Payer: Self-pay | Admitting: Orthopaedic Surgery

## 2020-09-07 DIAGNOSIS — S83241D Other tear of medial meniscus, current injury, right knee, subsequent encounter: Secondary | ICD-10-CM | POA: Diagnosis not present

## 2020-09-13 DIAGNOSIS — R2689 Other abnormalities of gait and mobility: Secondary | ICD-10-CM | POA: Diagnosis not present

## 2020-09-13 DIAGNOSIS — M25561 Pain in right knee: Secondary | ICD-10-CM | POA: Diagnosis not present

## 2020-09-13 DIAGNOSIS — R531 Weakness: Secondary | ICD-10-CM | POA: Diagnosis not present

## 2020-09-18 DIAGNOSIS — R531 Weakness: Secondary | ICD-10-CM | POA: Diagnosis not present

## 2020-09-18 DIAGNOSIS — M25561 Pain in right knee: Secondary | ICD-10-CM | POA: Diagnosis not present

## 2020-09-18 DIAGNOSIS — R2689 Other abnormalities of gait and mobility: Secondary | ICD-10-CM | POA: Diagnosis not present

## 2020-10-03 DIAGNOSIS — R2689 Other abnormalities of gait and mobility: Secondary | ICD-10-CM | POA: Diagnosis not present

## 2020-10-03 DIAGNOSIS — R531 Weakness: Secondary | ICD-10-CM | POA: Diagnosis not present

## 2020-10-03 DIAGNOSIS — M25561 Pain in right knee: Secondary | ICD-10-CM | POA: Diagnosis not present

## 2020-10-17 DIAGNOSIS — F411 Generalized anxiety disorder: Secondary | ICD-10-CM | POA: Diagnosis not present

## 2020-10-17 DIAGNOSIS — F331 Major depressive disorder, recurrent, moderate: Secondary | ICD-10-CM | POA: Diagnosis not present

## 2020-10-27 DIAGNOSIS — F411 Generalized anxiety disorder: Secondary | ICD-10-CM | POA: Diagnosis not present

## 2020-10-27 DIAGNOSIS — F331 Major depressive disorder, recurrent, moderate: Secondary | ICD-10-CM | POA: Diagnosis not present

## 2020-11-02 ENCOUNTER — Other Ambulatory Visit: Payer: PPO

## 2020-12-04 DIAGNOSIS — F411 Generalized anxiety disorder: Secondary | ICD-10-CM | POA: Diagnosis not present

## 2020-12-04 DIAGNOSIS — F331 Major depressive disorder, recurrent, moderate: Secondary | ICD-10-CM | POA: Diagnosis not present

## 2021-01-18 ENCOUNTER — Ambulatory Visit
Admission: RE | Admit: 2021-01-18 | Discharge: 2021-01-18 | Disposition: A | Discharge status: Home / Self Care | Payer: PPO | Source: Ambulatory Visit | Attending: Neurology | Admitting: Neurology

## 2021-01-18 DIAGNOSIS — G35 Multiple sclerosis: Secondary | ICD-10-CM | POA: Diagnosis not present

## 2021-01-18 MED ORDER — GADOBENATE DIMEGLUMINE 529 MG/ML IV SOLN
20.0000 mL | Freq: Once | INTRAVENOUS | Status: AC | PRN
  Administered 2021-01-18: 20 mL via INTRAVENOUS

## 2021-01-22 ENCOUNTER — Encounter: Payer: Self-pay | Admitting: Neurology

## 2021-01-22 ENCOUNTER — Ambulatory Visit (INDEPENDENT_AMBULATORY_CARE_PROVIDER_SITE_OTHER): Payer: PPO | Admitting: Neurology

## 2021-01-22 VITALS — BP 146/92 | HR 84 | Ht 66.0 in | Wt 217.0 lb

## 2021-01-22 DIAGNOSIS — R5383 Other fatigue: Secondary | ICD-10-CM

## 2021-01-22 DIAGNOSIS — R269 Unspecified abnormalities of gait and mobility: Secondary | ICD-10-CM | POA: Diagnosis not present

## 2021-01-22 DIAGNOSIS — E559 Vitamin D deficiency, unspecified: Secondary | ICD-10-CM | POA: Diagnosis not present

## 2021-01-22 DIAGNOSIS — G35 Multiple sclerosis: Secondary | ICD-10-CM | POA: Diagnosis not present

## 2021-01-22 DIAGNOSIS — G4711 Idiopathic hypersomnia with long sleep time: Secondary | ICD-10-CM | POA: Diagnosis not present

## 2021-01-22 DIAGNOSIS — Z79899 Other long term (current) drug therapy: Secondary | ICD-10-CM

## 2021-01-22 MED ORDER — BUPROPION HCL ER (XL) 300 MG PO TB24: 300.0000 mg | ORAL_TABLET | Freq: Every day | ORAL | 11 refills | Status: DC

## 2021-01-22 MED ORDER — ARMODAFINIL 250 MG PO TABS: 250.0000 mg | ORAL_TABLET | Freq: Every day | ORAL | 5 refills | Status: DC

## 2021-01-22 NOTE — Progress Notes (Signed)
[facility]  PATIENT: [name] DOB: 1975/12/16  REFERRING DOCTOR OR PCP:  Kelton Pillar SOURCE: [patient, records from [facility], MRI images on PACS  _________________________________   HISTORICAL  CHIEF COMPLAINT:  Chief Complaint  Patient presents with   Follow-up    RM 1,  alone. Doing well on leflunomide.    HISTORY OF PRESENT ILLNESS:  [name] Is a 45 year old woman with relapsing remitting multiple sclerosis.    She was diagnosed with MS in 2008.    Update 01/22/2021: She is on leflunomide and she tolerates it well.  She denies any new symptoms or exacerbations.  However, she feels weaker and she has more pins/needles dysesthesias.  She had an MRI recently.  It showed no new MS lesions.  Gait is baseline with no falls.  Sometimes balance is off and she veers some.   Her legs are mildly weak, left > right.   She gets some tingling in legs and feels weaker if she walks longer distance or is in the heat.   Sometimes the sensory symptoms come on randomly.   She has had frequent UTI's.   She has mild hesitancy but feels she empties.   She takes an antibiotic prophylactically at times.     She is seeing urology now.   Vision is correctable to 20/20.  Colors are symmetric.   Se has more trouble with night vision.  Cognitively, she feels getting from point A to B is more mentally challenging.  She occasionally notes eye pain. She has RNFL thinning on OCT.     She has fatigue that if often bad.   She also has more brain fog.  She feels both are worse with stress.   She runs out of energy most afternoons and does less because of it.     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  She does not have cataplexy or sleep paralysis though sometimes notes dreaming even before completely asleep.   Dreams can be 'insane' and often awaken her. She sometimes is not sure if she is awake or asleep.   She has sleepiness but does not fall asleep if mentally  active.    Armodafinil was better tolerated than Adderal IR (felt a rush then crash).   She has depression and is on fluoxetine 50 mg with some beneft for anxiety but probably not helping depression.    She was also seeing a therapist.    She is noting more cognitive issues and had formal neurocognitive testing in the past.   She had reduced executive functioning.   She is having more trouble figuring out how to get from point A to B in her car.   She has trouble with sensory overload at times.   Reduced verbal fluency     EPWORTH SLEEPINESS SCALE  On a scale of 0 - 3 what is the chance of dozing:  Sitting and Reading:   2 Watching TV:    2 Sitting inactive in a public place: 3 Passenger in car for one hour: 3 Lying down to rest in the afternoon: 3 Sitting and talking to someone: 0 Sitting quietly after lunch:  3 In a car, stopped in traffic:  2  Total (out of 24):    18/24   She took high dose Vit D bit has not taken OTC supplements since.     MS History:     In 2008, she presented with left-sided numbness and weakness. She also difficulty  with her gait. An MRI of the brain was consistent with multiple sclerosis. She was seeing Dr. Brett Fairy at the time.   =Initially, she was placed on Copaxone after she was diagnosed in 2008 but stopped after a year due to MRI changes.   Then, she was placed on Betaseron but she had an exacerbation and was referred to me. We started  Tysabri in 2012. She did well on Tysabri but was high titer JCV antibody positive and stopped after a year or so.  She was started on Gilenya but stopped due to shortness of breath.  She then went on Tecfidera. She had difficulty tolerating it.    She had left greater than right arm numbness 07/05/2012 and received 3 days of IV Solu-Medrol. Of note, she had stopped Tecfidera 2 months earlier because of GI issues. MRI of the cervical spine in 2011 showed a focus adjacent to C3. An MRI performed May 2014 showed 3 enhancing  lesions.   She switched to Aubagio.    An MRI of the brain 07/20/2013 at [facility] was unchanged compared to a previous one from 07/10/2012.   She had an exacerbation 12/20/2013 with right optic neuritis and received several days of IV Solu-Medrol.   Due to expensive copay, se switched to leflunomide.         Imaging: MRI of the brain 09/09/2018 showed T2/flair hyperintense foci in the hemispheres and the upper cervical spinal cord in a pattern and configuration consistent with chronic demyelinating plaque associated with multiple sclerosis.  None of the foci appears to be acute.  However, 3 small foci in the right frontal lobe and the left external capsule present on the current MRI were not present on the 2018 MRI.   There is a normal enhancement pattern and no new findings.  MRI of the cervical spine 09/09/2018 showed patchy foci within the upper cervical spine (C2, C2-C3 and C3) consistent with chronic demyelinating plaque associated with multiple sclerosis.  None of the foci appears to be acute.  There is no recent cervical spine MRI for comparison though 2 foci were noted in the upper cervical spine on the 2018 brain MRI.  One of these was present on the 2011 cervical spine MRI.      No significant degenerative changes.  No nerve root compression or spinal stenosis.  01/18/2021  MRI of the brain 01/18/2021 showed multiple T2/FLAIR hyperintense foci in the hemispheres and spinal cord in a pattern consistent with chronic demyelinating plaque associated with multiple sclerosis.  None of the foci enhanced or appear to be acute.  Compared to the MRI from 09/09/2018, there do not appear to be any new lesions.   REVIEW OF SYSTEMS: Constitutional: No fevers, chills, sweats, or change in appetite.   She has fatigue, daytime sleepiness and nighttime insomnia Eyes: No visual changes, double vision, eye pain Ear, nose and throat: No hearing loss, ear pain, nasal congestion, sore throat Cardiovascular: No chest  pain, palpitations Respiratory:  No shortness of breath at rest or with exertion.   No wheezes GastrointestinaI: No nausea, vomiting, diarrhea, abdominal pain, fecal incontinence Genitourinary:  No dysuria, urinary retention or frequency.  No nocturia. Musculoskeletal:  No neck pain, back pain Integumentary: No rash, pruritus, skin lesions Neurological: as above Psychiatric: As above Endocrine: No palpitations, diaphoresis, change in appetite, change in weigh or increased thirst Hematologic/Lymphatic:  No anemia, purpura, petechiae. Allergic/Immunologic: No itchy/runny eyes, nasal congestion, recent allergic reactions, rashes  ALLERGIES: No Known Allergies  HOME MEDICATIONS:  Current Outpatient Medications:    ALPRAZolam (XANAX) 1 MG tablet, Take 1 tablet (1 mg total) by mouth at bedtime as needed for anxiety., Disp: 30 tablet, Rfl: 5   amLODipine (NORVASC) 5 MG tablet, Take 5 mg by mouth daily., Disp: , Rfl:    Armodafinil 250 MG tablet, Take 1 tablet (250 mg total) by mouth daily., Disp: 30 tablet, Rfl: 5   baclofen (LIORESAL) 10 MG tablet, Take 10 mg by mouth 3 (three) times daily as needed for muscle spasms., Disp: , Rfl:    buPROPion (WELLBUTRIN XL) 300 MG 24 hr tablet, Take 1 tablet (300 mg total) by mouth daily., Disp: 30 tablet, Rfl: 11   cephALEXin (KEFLEX) 500 MG capsule, Take 1 capsule by mouth 2 (two) times daily., Disp: , Rfl:    FLUoxetine (PROZAC) 10 MG capsule, Take 10 mg by mouth daily., Disp: , Rfl:    FLUoxetine (PROZAC) 40 MG capsule, Take 40 mg by mouth daily as needed., Disp: , Rfl:    leflunomide (ARAVA) 20 MG tablet, Take 1 tablet (20 mg total) by mouth daily., Disp: 90 tablet, Rfl: 3   levonorgestrel (MIRENA) 20 MCG/DAY IUD, 1 each by Intrauterine route once., Disp: , Rfl:    meloxicam (MOBIC) 15 MG tablet, Take 1 tablet (15 mg total) by mouth daily. For pain and inflammation, Disp: 30 tablet, Rfl: 0   TURMERIC-GINGER PO, Take 1 capsule by mouth daily., Disp: ,  Rfl:    valACYclovir (VALTREX) 500 MG tablet, Take one tablet twice daily as needed for cold sores., Disp: 30 tablet, Rfl: 0   Vitamin D-Vitamin K (VITAMIN K2-VITAMIN D3 PO), Take 1 capsule by mouth daily., Disp: , Rfl:    buPROPion (WELLBUTRIN XL) 300 MG 24 hr tablet, Take 1 tablet (300 mg total) by mouth every morning., Disp: 30 tablet, Rfl: 1  PAST MEDICAL HISTORY: Past Medical History:  Diagnosis Date   Anxiety    Depression    Hypertension    MS (multiple sclerosis) ([place])    Diagnosed in 2005   MS (multiple sclerosis) ([place])     PAST SURGICAL HISTORY: Past Surgical History:  Procedure Laterality Date   KNEE ARTHROSCOPY WITH MEDIAL MENISECTOMY Right 08/30/2020   Procedure: KNEE ARTHROSCOPY WITH MEDIAL MENISECTOMY WITH CYST DECOMPRESSION;  Surgeon: Hiram Gash, MD;  Location: [facility];  Service: Orthopedics;  Laterality: Right;   MOUTH SURGERY     WISDOM TOOTH EXTRACTION      FAMILY HISTORY: Family History  Problem Relation Age of Onset   Depression Mother     SOCIAL HISTORY:  Social History   Socioeconomic History   Marital status: Divorced    Spouse name: Not on file   Number of children: Not on file   Years of education: Not on file   Highest education level: Not on file  Occupational History   Not on file  Tobacco Use   Smoking status: Former    Types: Cigarettes   Smokeless tobacco: Never  Vaping Use   Vaping Use: Never used  Substance and Sexual Activity   Alcohol use: Yes    Alcohol/week: 0.0 standard drinks    Comment: occasional/fim   Drug use: No   Sexual activity: Not on file  Other Topics Concern   Not on file  Social History Narrative   Not on file   Social Determinants of Health   Financial Resource Strain: Not on file  Food Insecurity: Not on file  Transportation Needs: Not on  file  Physical Activity: Not on file  Stress: Not on file  Social Connections: Not on file  Intimate Partner Violence: Not on file      PHYSICAL EXAM  Vitals:   01/22/21 1257  BP: (!) 146/92  Pulse: 84  Weight: 217 lb (98.4 kg)  Height: 5\' 6"  (1.676 m)    Body mass index is 35.02 kg/m.   General: The patient is well-developed and well-nourished and in no acute distress.     Neurologic Exam  Mental status: The patient is alert and oriented x 3 at the time of the examination. The patient has apparent normal recent and remote memory, with an apparently normal attention span and concentration ability.   Speech with a few errors.  Cranial nerves: Extraocular movements are full.  Color vision was symmetric.  Facial strength and sensation was normal.. No obvious hearing deficits are noted.  Motor:  Muscle bulk is normal.   Tone is normal. Strength is  5 / 5 in all 4 extremities.   Sensory: Intact sensation to touch and vibration in the limbs.  Coordination: Cerebellar testing reveals good finger-nose-finger.  Gait and station: Station is normal.   Gait is mildly wide.  Tandem gait is wide.  The Romberg is negative.  Reflexes: Deep tendon reflexes are symmetric.   Leg DTRs are brisk with spread at the knees but no ankle clonus.  .      ASSESSMENT AND PLAN  Multiple sclerosis ([place]) - Plan: Comprehensive metabolic panel, CBC with Differential/Platelet  High risk medication use - Plan: Comprehensive metabolic panel, CBC with Differential/Platelet  Vitamin D deficiency - Plan: VITAMIN D 25 Hydroxy (Vit-D Deficiency, Fractures)  Hypersomnia, idiopathic  Other fatigue  Gait disturbance   1.   For MS, continue leflunomide.  Check labs.  We discussed the recent MRI showing no new lesions. 2.   Nuvigil for excessive daytime sleepiness due to narcolepsy or idiopathic hypersomnia .     Prozac and add back Wellbutrin for mood 3.  We discussed trial of protriptyline if dream issues persist and/or Wakix for daytime sleepiness. 4.    She will return to see me in 6 months if stable or call sooner if she is having  new or worsening neurologic symptoms.  40 minute office visit with the majority of the time spent face-to-face for history and physical, discussion/counseling and decision-making.  Additional time with record review and documentation.   Brookelle Pellicane A. Felecia Shelling, MD, PhD 32/67/1245, 8:09 PM Certified in Neurology, Clinical Neurophysiology, Sleep Medicine, Pain Medicine and Neuroimaging  [facility] [address] [place], [place] [address] [phone]

## 2021-01-23 ENCOUNTER — Telehealth: Payer: Self-pay

## 2021-01-23 LAB — CBC WITH DIFFERENTIAL/PLATELET
Basophils Absolute: 0 10*3/uL (ref 0.0–0.2)
Basos: 0 %
EOS (ABSOLUTE): 0.1 10*3/uL (ref 0.0–0.4)
Eos: 1 %
Hematocrit: 44.1 % (ref 34.0–46.6)
Hemoglobin: 15.1 g/dL (ref 11.1–15.9)
Immature Grans (Abs): 0 10*3/uL (ref 0.0–0.1)
Immature Granulocytes: 0 %
Lymphocytes Absolute: 1.6 10*3/uL (ref 0.7–3.1)
Lymphs: 22 %
MCH: 33 pg (ref 26.6–33.0)
MCHC: 34.2 g/dL (ref 31.5–35.7)
MCV: 96 fL (ref 79–97)
Monocytes Absolute: 0.7 10*3/uL (ref 0.1–0.9)
Monocytes: 10 %
Neutrophils Absolute: 5.1 10*3/uL (ref 1.4–7.0)
Neutrophils: 67 %
Platelets: 248 10*3/uL (ref 150–450)
RBC: 4.58 x10E6/uL (ref 3.77–5.28)
RDW: 12.3 % (ref 11.7–15.4)
WBC: 7.6 10*3/uL (ref 3.4–10.8)

## 2021-01-23 LAB — COMPREHENSIVE METABOLIC PANEL
ALT: 21 IU/L (ref 0–32)
AST: 19 IU/L (ref 0–40)
Albumin/Globulin Ratio: 1.7 (ref 1.2–2.2)
Albumin: 4.7 g/dL (ref 3.8–4.8)
Alkaline Phosphatase: 94 IU/L (ref 44–121)
BUN/Creatinine Ratio: 17 (ref 9–23)
BUN: 14 mg/dL (ref 6–24)
Bilirubin Total: 0.4 mg/dL (ref 0.0–1.2)
CO2: 22 mmol/L (ref 20–29)
Calcium: 9.7 mg/dL (ref 8.7–10.2)
Chloride: 96 mmol/L (ref 96–106)
Creatinine, Ser: 0.84 mg/dL (ref 0.57–1.00)
Globulin, Total: 2.7 g/dL (ref 1.5–4.5)
Glucose: 94 mg/dL (ref 70–99)
Potassium: 4.9 mmol/L (ref 3.5–5.2)
Sodium: 132 mmol/L — ABNORMAL LOW (ref 134–144)
Total Protein: 7.4 g/dL (ref 6.0–8.5)
eGFR: 88 mL/min/{1.73_m2} (ref >59)

## 2021-01-23 LAB — VITAMIN D 25 HYDROXY (VIT D DEFICIENCY, FRACTURES): Vit D, 25-Hydroxy: 24.8 ng/mL — ABNORMAL LOW (ref 30.0–100.0)

## 2021-01-23 NOTE — Telephone Encounter (Signed)
PA for Armodafinil 250 mg has been sent via cover my meds.  (Key: [id number])  This request is still being processed. You may close this dialog, return to your dashboard, and perform other tasks. To check for an update later, open this request again from your dashboard. If you have any questions, please contact [employer] at [phone].

## 2021-01-25 ENCOUNTER — Encounter: Payer: Self-pay | Admitting: Neurology

## 2021-01-25 NOTE — Telephone Encounter (Signed)
PA was denied as they do not recognize MS fatigue as diagnosis. I have informed the pt of this information and advised her to utilize goodrx which she has done in the past.

## 2021-01-30 DIAGNOSIS — Z0271 Encounter for disability determination: Secondary | ICD-10-CM

## 2021-04-11 DIAGNOSIS — Z30431 Encounter for routine checking of intrauterine contraceptive device: Secondary | ICD-10-CM | POA: Diagnosis not present

## 2021-04-11 DIAGNOSIS — Z113 Encounter for screening for infections with a predominantly sexual mode of transmission: Secondary | ICD-10-CM | POA: Diagnosis not present

## 2021-04-11 DIAGNOSIS — Z01411 Encounter for gynecological examination (general) (routine) with abnormal findings: Secondary | ICD-10-CM | POA: Diagnosis not present

## 2021-04-11 DIAGNOSIS — Z1159 Encounter for screening for other viral diseases: Secondary | ICD-10-CM | POA: Diagnosis not present

## 2021-04-11 DIAGNOSIS — Z118 Encounter for screening for other infectious and parasitic diseases: Secondary | ICD-10-CM | POA: Diagnosis not present

## 2021-04-11 DIAGNOSIS — Z01419 Encounter for gynecological examination (general) (routine) without abnormal findings: Secondary | ICD-10-CM | POA: Diagnosis not present

## 2021-04-11 DIAGNOSIS — R87612 Low grade squamous intraepithelial lesion on cytologic smear of cervix (LGSIL): Secondary | ICD-10-CM | POA: Diagnosis not present

## 2021-04-11 DIAGNOSIS — R87810 Cervical high risk human papillomavirus (HPV) DNA test positive: Secondary | ICD-10-CM | POA: Diagnosis not present

## 2021-04-11 DIAGNOSIS — Z114 Encounter for screening for human immunodeficiency virus [HIV]: Secondary | ICD-10-CM | POA: Diagnosis not present

## 2021-04-11 DIAGNOSIS — Z1231 Encounter for screening mammogram for malignant neoplasm of breast: Secondary | ICD-10-CM | POA: Diagnosis not present

## 2021-04-11 DIAGNOSIS — R635 Abnormal weight gain: Secondary | ICD-10-CM | POA: Diagnosis not present

## 2021-04-11 DIAGNOSIS — Z124 Encounter for screening for malignant neoplasm of cervix: Secondary | ICD-10-CM | POA: Diagnosis not present

## 2021-04-11 DIAGNOSIS — Z6834 Body mass index (BMI) 34.0-34.9, adult: Secondary | ICD-10-CM | POA: Diagnosis not present

## 2021-04-16 DIAGNOSIS — F411 Generalized anxiety disorder: Secondary | ICD-10-CM | POA: Diagnosis not present

## 2021-04-16 DIAGNOSIS — F331 Major depressive disorder, recurrent, moderate: Secondary | ICD-10-CM | POA: Diagnosis not present

## 2021-04-25 DIAGNOSIS — F331 Major depressive disorder, recurrent, moderate: Secondary | ICD-10-CM | POA: Diagnosis not present

## 2021-04-25 DIAGNOSIS — F411 Generalized anxiety disorder: Secondary | ICD-10-CM | POA: Diagnosis not present

## 2021-05-14 DIAGNOSIS — R87810 Cervical high risk human papillomavirus (HPV) DNA test positive: Secondary | ICD-10-CM | POA: Diagnosis not present

## 2021-05-14 DIAGNOSIS — R87612 Low grade squamous intraepithelial lesion on cytologic smear of cervix (LGSIL): Secondary | ICD-10-CM | POA: Diagnosis not present

## 2021-05-14 DIAGNOSIS — D061 Carcinoma in situ of exocervix: Secondary | ICD-10-CM | POA: Diagnosis not present

## 2021-05-14 DIAGNOSIS — R87610 Atypical squamous cells of undetermined significance on cytologic smear of cervix (ASC-US): Secondary | ICD-10-CM | POA: Diagnosis not present

## 2021-05-14 DIAGNOSIS — N871 Moderate cervical dysplasia: Secondary | ICD-10-CM | POA: Diagnosis not present

## 2021-05-14 DIAGNOSIS — R87611 Atypical squamous cells cannot exclude high grade squamous intraepithelial lesion on cytologic smear of cervix (ASC-H): Secondary | ICD-10-CM | POA: Diagnosis not present

## 2021-05-28 ENCOUNTER — Other Ambulatory Visit: Payer: Self-pay | Admitting: Neurology

## 2021-06-13 DIAGNOSIS — F331 Major depressive disorder, recurrent, moderate: Secondary | ICD-10-CM | POA: Diagnosis not present

## 2021-06-13 DIAGNOSIS — F411 Generalized anxiety disorder: Secondary | ICD-10-CM | POA: Diagnosis not present

## 2021-06-20 DIAGNOSIS — Z30431 Encounter for routine checking of intrauterine contraceptive device: Secondary | ICD-10-CM | POA: Diagnosis not present

## 2021-06-20 DIAGNOSIS — R87611 Atypical squamous cells cannot exclude high grade squamous intraepithelial lesion on cytologic smear of cervix (ASC-H): Secondary | ICD-10-CM | POA: Diagnosis not present

## 2021-06-20 DIAGNOSIS — N72 Inflammatory disease of cervix uteri: Secondary | ICD-10-CM | POA: Diagnosis not present

## 2021-06-20 DIAGNOSIS — R87613 High grade squamous intraepithelial lesion on cytologic smear of cervix (HGSIL): Secondary | ICD-10-CM | POA: Diagnosis not present

## 2021-06-20 DIAGNOSIS — D069 Carcinoma in situ of cervix, unspecified: Secondary | ICD-10-CM | POA: Diagnosis not present

## 2021-06-20 DIAGNOSIS — R87810 Cervical high risk human papillomavirus (HPV) DNA test positive: Secondary | ICD-10-CM | POA: Diagnosis not present

## 2021-07-03 DIAGNOSIS — Z720 Tobacco use: Secondary | ICD-10-CM | POA: Diagnosis not present

## 2021-07-03 DIAGNOSIS — D069 Carcinoma in situ of cervix, unspecified: Secondary | ICD-10-CM | POA: Diagnosis not present

## 2021-07-23 DIAGNOSIS — F411 Generalized anxiety disorder: Secondary | ICD-10-CM | POA: Diagnosis not present

## 2021-07-23 DIAGNOSIS — F331 Major depressive disorder, recurrent, moderate: Secondary | ICD-10-CM | POA: Diagnosis not present

## 2021-07-26 ENCOUNTER — Ambulatory Visit: Payer: PPO | Admitting: Neurology

## 2021-08-24 ENCOUNTER — Other Ambulatory Visit: Payer: Self-pay | Admitting: Neurology

## 2021-08-28 DIAGNOSIS — M9903 Segmental and somatic dysfunction of lumbar region: Secondary | ICD-10-CM | POA: Diagnosis not present

## 2021-08-28 DIAGNOSIS — M9902 Segmental and somatic dysfunction of thoracic region: Secondary | ICD-10-CM | POA: Diagnosis not present

## 2021-08-28 DIAGNOSIS — M5134 Other intervertebral disc degeneration, thoracic region: Secondary | ICD-10-CM | POA: Diagnosis not present

## 2021-08-28 DIAGNOSIS — M5032 Other cervical disc degeneration, mid-cervical region, unspecified level: Secondary | ICD-10-CM | POA: Diagnosis not present

## 2021-08-28 DIAGNOSIS — M9901 Segmental and somatic dysfunction of cervical region: Secondary | ICD-10-CM | POA: Diagnosis not present

## 2021-08-28 DIAGNOSIS — M5136 Other intervertebral disc degeneration, lumbar region: Secondary | ICD-10-CM | POA: Diagnosis not present

## 2021-08-29 DIAGNOSIS — M5136 Other intervertebral disc degeneration, lumbar region: Secondary | ICD-10-CM | POA: Diagnosis not present

## 2021-08-29 DIAGNOSIS — M9903 Segmental and somatic dysfunction of lumbar region: Secondary | ICD-10-CM | POA: Diagnosis not present

## 2021-08-29 DIAGNOSIS — M5032 Other cervical disc degeneration, mid-cervical region, unspecified level: Secondary | ICD-10-CM | POA: Diagnosis not present

## 2021-08-29 DIAGNOSIS — M9901 Segmental and somatic dysfunction of cervical region: Secondary | ICD-10-CM | POA: Diagnosis not present

## 2021-08-29 DIAGNOSIS — M5134 Other intervertebral disc degeneration, thoracic region: Secondary | ICD-10-CM | POA: Diagnosis not present

## 2021-08-29 DIAGNOSIS — M9902 Segmental and somatic dysfunction of thoracic region: Secondary | ICD-10-CM | POA: Diagnosis not present

## 2021-09-03 DIAGNOSIS — M5032 Other cervical disc degeneration, mid-cervical region, unspecified level: Secondary | ICD-10-CM | POA: Diagnosis not present

## 2021-09-03 DIAGNOSIS — M9903 Segmental and somatic dysfunction of lumbar region: Secondary | ICD-10-CM | POA: Diagnosis not present

## 2021-09-03 DIAGNOSIS — M9902 Segmental and somatic dysfunction of thoracic region: Secondary | ICD-10-CM | POA: Diagnosis not present

## 2021-09-03 DIAGNOSIS — M5136 Other intervertebral disc degeneration, lumbar region: Secondary | ICD-10-CM | POA: Diagnosis not present

## 2021-09-03 DIAGNOSIS — M9901 Segmental and somatic dysfunction of cervical region: Secondary | ICD-10-CM | POA: Diagnosis not present

## 2021-09-03 DIAGNOSIS — M5134 Other intervertebral disc degeneration, thoracic region: Secondary | ICD-10-CM | POA: Diagnosis not present

## 2021-09-10 DIAGNOSIS — M5134 Other intervertebral disc degeneration, thoracic region: Secondary | ICD-10-CM | POA: Diagnosis not present

## 2021-09-10 DIAGNOSIS — M9901 Segmental and somatic dysfunction of cervical region: Secondary | ICD-10-CM | POA: Diagnosis not present

## 2021-09-10 DIAGNOSIS — M9903 Segmental and somatic dysfunction of lumbar region: Secondary | ICD-10-CM | POA: Diagnosis not present

## 2021-09-10 DIAGNOSIS — M9902 Segmental and somatic dysfunction of thoracic region: Secondary | ICD-10-CM | POA: Diagnosis not present

## 2021-09-10 DIAGNOSIS — M5136 Other intervertebral disc degeneration, lumbar region: Secondary | ICD-10-CM | POA: Diagnosis not present

## 2021-09-10 DIAGNOSIS — M5032 Other cervical disc degeneration, mid-cervical region, unspecified level: Secondary | ICD-10-CM | POA: Diagnosis not present

## 2021-09-12 DIAGNOSIS — M5032 Other cervical disc degeneration, mid-cervical region, unspecified level: Secondary | ICD-10-CM | POA: Diagnosis not present

## 2021-09-12 DIAGNOSIS — M5134 Other intervertebral disc degeneration, thoracic region: Secondary | ICD-10-CM | POA: Diagnosis not present

## 2021-09-12 DIAGNOSIS — M9901 Segmental and somatic dysfunction of cervical region: Secondary | ICD-10-CM | POA: Diagnosis not present

## 2021-09-12 DIAGNOSIS — M9902 Segmental and somatic dysfunction of thoracic region: Secondary | ICD-10-CM | POA: Diagnosis not present

## 2021-09-12 DIAGNOSIS — M9903 Segmental and somatic dysfunction of lumbar region: Secondary | ICD-10-CM | POA: Diagnosis not present

## 2021-09-12 DIAGNOSIS — M5136 Other intervertebral disc degeneration, lumbar region: Secondary | ICD-10-CM | POA: Diagnosis not present

## 2021-09-19 DIAGNOSIS — M9902 Segmental and somatic dysfunction of thoracic region: Secondary | ICD-10-CM | POA: Diagnosis not present

## 2021-09-19 DIAGNOSIS — M5032 Other cervical disc degeneration, mid-cervical region, unspecified level: Secondary | ICD-10-CM | POA: Diagnosis not present

## 2021-09-19 DIAGNOSIS — M9901 Segmental and somatic dysfunction of cervical region: Secondary | ICD-10-CM | POA: Diagnosis not present

## 2021-09-19 DIAGNOSIS — M5134 Other intervertebral disc degeneration, thoracic region: Secondary | ICD-10-CM | POA: Diagnosis not present

## 2021-09-19 DIAGNOSIS — M9903 Segmental and somatic dysfunction of lumbar region: Secondary | ICD-10-CM | POA: Diagnosis not present

## 2021-09-19 DIAGNOSIS — M5136 Other intervertebral disc degeneration, lumbar region: Secondary | ICD-10-CM | POA: Diagnosis not present

## 2021-10-09 DIAGNOSIS — M5134 Other intervertebral disc degeneration, thoracic region: Secondary | ICD-10-CM | POA: Diagnosis not present

## 2021-10-09 DIAGNOSIS — M5032 Other cervical disc degeneration, mid-cervical region, unspecified level: Secondary | ICD-10-CM | POA: Diagnosis not present

## 2021-10-09 DIAGNOSIS — M9902 Segmental and somatic dysfunction of thoracic region: Secondary | ICD-10-CM | POA: Diagnosis not present

## 2021-10-09 DIAGNOSIS — M9901 Segmental and somatic dysfunction of cervical region: Secondary | ICD-10-CM | POA: Diagnosis not present

## 2021-10-09 DIAGNOSIS — M5136 Other intervertebral disc degeneration, lumbar region: Secondary | ICD-10-CM | POA: Diagnosis not present

## 2021-10-09 DIAGNOSIS — M9903 Segmental and somatic dysfunction of lumbar region: Secondary | ICD-10-CM | POA: Diagnosis not present

## 2021-10-10 DIAGNOSIS — M5032 Other cervical disc degeneration, mid-cervical region, unspecified level: Secondary | ICD-10-CM | POA: Diagnosis not present

## 2021-10-10 DIAGNOSIS — M9901 Segmental and somatic dysfunction of cervical region: Secondary | ICD-10-CM | POA: Diagnosis not present

## 2021-10-10 DIAGNOSIS — M9902 Segmental and somatic dysfunction of thoracic region: Secondary | ICD-10-CM | POA: Diagnosis not present

## 2021-10-10 DIAGNOSIS — M5134 Other intervertebral disc degeneration, thoracic region: Secondary | ICD-10-CM | POA: Diagnosis not present

## 2021-10-10 DIAGNOSIS — M5136 Other intervertebral disc degeneration, lumbar region: Secondary | ICD-10-CM | POA: Diagnosis not present

## 2021-10-10 DIAGNOSIS — M9903 Segmental and somatic dysfunction of lumbar region: Secondary | ICD-10-CM | POA: Diagnosis not present

## 2021-10-17 DIAGNOSIS — M9901 Segmental and somatic dysfunction of cervical region: Secondary | ICD-10-CM | POA: Diagnosis not present

## 2021-10-17 DIAGNOSIS — M5134 Other intervertebral disc degeneration, thoracic region: Secondary | ICD-10-CM | POA: Diagnosis not present

## 2021-10-17 DIAGNOSIS — M5136 Other intervertebral disc degeneration, lumbar region: Secondary | ICD-10-CM | POA: Diagnosis not present

## 2021-10-17 DIAGNOSIS — M9902 Segmental and somatic dysfunction of thoracic region: Secondary | ICD-10-CM | POA: Diagnosis not present

## 2021-10-17 DIAGNOSIS — M9903 Segmental and somatic dysfunction of lumbar region: Secondary | ICD-10-CM | POA: Diagnosis not present

## 2021-10-17 DIAGNOSIS — M5032 Other cervical disc degeneration, mid-cervical region, unspecified level: Secondary | ICD-10-CM | POA: Diagnosis not present

## 2021-10-24 DIAGNOSIS — M9903 Segmental and somatic dysfunction of lumbar region: Secondary | ICD-10-CM | POA: Diagnosis not present

## 2021-10-24 DIAGNOSIS — M5136 Other intervertebral disc degeneration, lumbar region: Secondary | ICD-10-CM | POA: Diagnosis not present

## 2021-10-24 DIAGNOSIS — M9902 Segmental and somatic dysfunction of thoracic region: Secondary | ICD-10-CM | POA: Diagnosis not present

## 2021-10-24 DIAGNOSIS — M9901 Segmental and somatic dysfunction of cervical region: Secondary | ICD-10-CM | POA: Diagnosis not present

## 2021-10-24 DIAGNOSIS — M5032 Other cervical disc degeneration, mid-cervical region, unspecified level: Secondary | ICD-10-CM | POA: Diagnosis not present

## 2021-10-24 DIAGNOSIS — M5134 Other intervertebral disc degeneration, thoracic region: Secondary | ICD-10-CM | POA: Diagnosis not present

## 2021-10-31 DIAGNOSIS — M5136 Other intervertebral disc degeneration, lumbar region: Secondary | ICD-10-CM | POA: Diagnosis not present

## 2021-10-31 DIAGNOSIS — M9901 Segmental and somatic dysfunction of cervical region: Secondary | ICD-10-CM | POA: Diagnosis not present

## 2021-10-31 DIAGNOSIS — M5134 Other intervertebral disc degeneration, thoracic region: Secondary | ICD-10-CM | POA: Diagnosis not present

## 2021-10-31 DIAGNOSIS — M9903 Segmental and somatic dysfunction of lumbar region: Secondary | ICD-10-CM | POA: Diagnosis not present

## 2021-10-31 DIAGNOSIS — M5032 Other cervical disc degeneration, mid-cervical region, unspecified level: Secondary | ICD-10-CM | POA: Diagnosis not present

## 2021-10-31 DIAGNOSIS — M9902 Segmental and somatic dysfunction of thoracic region: Secondary | ICD-10-CM | POA: Diagnosis not present

## 2021-11-13 DIAGNOSIS — M5134 Other intervertebral disc degeneration, thoracic region: Secondary | ICD-10-CM | POA: Diagnosis not present

## 2021-11-13 DIAGNOSIS — M9901 Segmental and somatic dysfunction of cervical region: Secondary | ICD-10-CM | POA: Diagnosis not present

## 2021-11-13 DIAGNOSIS — M5136 Other intervertebral disc degeneration, lumbar region: Secondary | ICD-10-CM | POA: Diagnosis not present

## 2021-11-13 DIAGNOSIS — M9902 Segmental and somatic dysfunction of thoracic region: Secondary | ICD-10-CM | POA: Diagnosis not present

## 2021-11-13 DIAGNOSIS — M9903 Segmental and somatic dysfunction of lumbar region: Secondary | ICD-10-CM | POA: Diagnosis not present

## 2021-11-13 DIAGNOSIS — M5032 Other cervical disc degeneration, mid-cervical region, unspecified level: Secondary | ICD-10-CM | POA: Diagnosis not present

## 2021-11-15 DIAGNOSIS — F411 Generalized anxiety disorder: Secondary | ICD-10-CM | POA: Diagnosis not present

## 2021-11-15 DIAGNOSIS — F331 Major depressive disorder, recurrent, moderate: Secondary | ICD-10-CM | POA: Diagnosis not present

## 2021-11-29 DIAGNOSIS — F331 Major depressive disorder, recurrent, moderate: Secondary | ICD-10-CM | POA: Diagnosis not present

## 2021-11-29 DIAGNOSIS — F411 Generalized anxiety disorder: Secondary | ICD-10-CM | POA: Diagnosis not present

## 2021-12-03 DIAGNOSIS — D069 Carcinoma in situ of cervix, unspecified: Secondary | ICD-10-CM | POA: Diagnosis not present

## 2021-12-03 DIAGNOSIS — R87613 High grade squamous intraepithelial lesion on cytologic smear of cervix (HGSIL): Secondary | ICD-10-CM | POA: Diagnosis not present

## 2021-12-03 DIAGNOSIS — R87810 Cervical high risk human papillomavirus (HPV) DNA test positive: Secondary | ICD-10-CM | POA: Diagnosis not present

## 2021-12-27 DIAGNOSIS — F411 Generalized anxiety disorder: Secondary | ICD-10-CM | POA: Diagnosis not present

## 2021-12-27 DIAGNOSIS — F331 Major depressive disorder, recurrent, moderate: Secondary | ICD-10-CM | POA: Diagnosis not present

## 2022-01-24 DIAGNOSIS — M545 Low back pain, unspecified: Secondary | ICD-10-CM | POA: Diagnosis not present

## 2022-02-14 DIAGNOSIS — F411 Generalized anxiety disorder: Secondary | ICD-10-CM | POA: Diagnosis not present

## 2022-02-14 DIAGNOSIS — F331 Major depressive disorder, recurrent, moderate: Secondary | ICD-10-CM | POA: Diagnosis not present

## 2022-03-06 ENCOUNTER — Ambulatory Visit: Payer: PPO | Admitting: Neurology

## 2022-03-06 ENCOUNTER — Encounter: Payer: Self-pay | Admitting: Neurology

## 2022-03-06 ENCOUNTER — Telehealth: Payer: Self-pay | Admitting: Neurology

## 2022-03-06 VITALS — BP 141/96 | HR 73 | Ht 65.5 in | Wt 221.0 lb

## 2022-03-06 DIAGNOSIS — F32A Depression, unspecified: Secondary | ICD-10-CM

## 2022-03-06 DIAGNOSIS — Z79899 Other long term (current) drug therapy: Secondary | ICD-10-CM | POA: Diagnosis not present

## 2022-03-06 DIAGNOSIS — G35 Multiple sclerosis: Secondary | ICD-10-CM | POA: Diagnosis not present

## 2022-03-06 DIAGNOSIS — R269 Unspecified abnormalities of gait and mobility: Secondary | ICD-10-CM

## 2022-03-06 DIAGNOSIS — E559 Vitamin D deficiency, unspecified: Secondary | ICD-10-CM | POA: Diagnosis not present

## 2022-03-06 DIAGNOSIS — R5383 Other fatigue: Secondary | ICD-10-CM

## 2022-03-06 DIAGNOSIS — Z8669 Personal history of other diseases of the nervous system and sense organs: Secondary | ICD-10-CM | POA: Diagnosis not present

## 2022-03-06 DIAGNOSIS — G4711 Idiopathic hypersomnia with long sleep time: Secondary | ICD-10-CM

## 2022-03-06 MED ORDER — SERTRALINE HCL 100 MG PO TABS: 100.0000 mg | ORAL_TABLET | Freq: Every day | ORAL | 3 refills | Status: DC

## 2022-03-06 MED ORDER — ARMODAFINIL 250 MG PO TABS: 250.0000 mg | ORAL_TABLET | Freq: Every day | ORAL | 1 refills | Status: DC

## 2022-03-06 MED ORDER — ALPRAZOLAM 1 MG PO TABS: 1.0000 mg | ORAL_TABLET | Freq: Every evening | ORAL | 5 refills | Status: DC | PRN

## 2022-03-06 MED ORDER — LEFLUNOMIDE 20 MG PO TABS: 20.0000 mg | ORAL_TABLET | Freq: Every day | ORAL | 3 refills | Status: DC

## 2022-03-06 NOTE — Progress Notes (Signed)
[facility]  PATIENT: [name] DOB: 1975/08/13  REFERRING DOCTOR OR PCP:  Kelton Pillar SOURCE: [patient, records from [facility], MRI images on PACS  _________________________________   HISTORICAL  CHIEF COMPLAINT:  Chief Complaint  Patient presents with   Follow-up    Patient is in room 11 alone here for follow up MS on leflunomide. Reports doing well on medication, would like to discuss anxiety medication.  Would like refill on Xanax.  Blood pressure is elevated reports she did not take her medication this am.   Visual Field Change    HISTORY OF PRESENT ILLNESS:  [name] Is a 47 year old woman with relapsing remitting multiple sclerosis.    She was diagnosed with MS in 2008.    Update 03/06/2022: She is on leflunomide and she tolerates it well.  She denies any new symptoms or exacerbations.  However, she feels weaker and she has more pins/needles dysesthesias.  She had an MRI recently.  It showed no new MS lesions.  She is having more mood issues.   She stopped Wellbutrin and fluoxetine because she felt some rage at times.     She is currently noting anxiety > depression. However, tearful at times, sometimes inappropriate.   She no longer feels rage.    She was going through a lot las year.   She is seeing a therapist.      Gait is baseline with no falls.  Sometimes balance is off and she veers some.   No falls. Her left leg is mildly weak.   She gets some tingling in legs and feels weaker if she walks longer distance or is in the heat.   Sometimes the sensory symptoms come on randomly.   She has had frequent UTI's.   She has mild hesitancy but feels she empties.   She takes an antibiotic prophylactically at times.     She is seeing urology now.   Vision is correctable to 20/20.  Colors are symmetric.   Se has more trouble with night vision.  Cognitively, she feels getting from point A to B is more mentally challenging.  She  occasionally notes eye pain. She has RNFL thinning on OCT.     She has fatigue that if often bad.   She also has more brain fog.  She feels both are worse with stress.   She runs out of energy most afternoons and does less because of it.     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  She does not have cataplexy or sleep paralysis though sometimes notes dreaming even before completely asleep.   Dreams can be 'insane' and often awaken her. She sometimes is not sure if she is awake or asleep.   She has sleepiness but does not fall asleep if mentally active.  She goes to bed at 9 pm.  She takes naps many day if extreme sleepiness hits.      Armodafinil was better tolerated than Adderal IR or Rtalin (felt a rush then crash).   She has some cognitive issues and had formal neurocognitive testing in the past.  She feels she is worse than last year.    She had reduced executive functioning.   She is having more trouble figuring out how to get from point A to B in her car.   She has trouble with sensory overload at times.   Reduced verbal fluency     EPWORTH SLEEPINESS SCALE  On a scale of 0 -  3 what is the chance of dozing:  Sitting and Reading:   2 Watching TV:    2 Sitting inactive in a public place: 1 Passenger in car for one hour: 3 Lying down to rest in the afternoon: 3 Sitting and talking to someone: 0 Sitting quietly after lunch:  3 In a car, stopped in traffic:  1  Total (out of 24):    16/24  when not on armodafinil   She took high dose Vit D bit has not taken OTC supplements since.     MS History:     In 2008, she presented with left-sided numbness and weakness. She also difficulty with her gait. An MRI of the brain was consistent with multiple sclerosis. She was seeing Dr. Brett Fairy at the time.   =Initially, she was placed on Copaxone after she was diagnosed in 2008 but stopped after a year due to MRI changes.   Then, she was placed on Betaseron but she had an exacerbation and was referred to  me. We started  Tysabri in 2012. She did well on Tysabri but was high titer JCV antibody positive and stopped after a year or so.  She was started on Gilenya but stopped due to shortness of breath.  She then went on Tecfidera. She had difficulty tolerating it.    She had left greater than right arm numbness 07/05/2012 and received 3 days of IV Solu-Medrol. Of note, she had stopped Tecfidera 2 months earlier because of GI issues. MRI of the cervical spine in 2011 showed a focus adjacent to C3. An MRI performed May 2014 showed 3 enhancing lesions.   She switched to Aubagio.    An MRI of the brain 07/20/2013 at [facility] was unchanged compared to a previous one from 07/10/2012.   She had an exacerbation 12/20/2013 with right optic neuritis and received several days of IV Solu-Medrol.   Due to expensive copay, se switched to leflunomide.         Imaging: MRI of the brain 09/09/2018 showed T2/flair hyperintense foci in the hemispheres and the upper cervical spinal cord in a pattern and configuration consistent with chronic demyelinating plaque associated with multiple sclerosis.  None of the foci appears to be acute.  However, 3 small foci in the right frontal lobe and the left external capsule present on the current MRI were not present on the 2018 MRI.   There is a normal enhancement pattern and no new findings.  MRI of the cervical spine 09/09/2018 showed patchy foci within the upper cervical spine (C2, C2-C3 and C3) consistent with chronic demyelinating plaque associated with multiple sclerosis.  None of the foci appears to be acute.  There is no recent cervical spine MRI for comparison though 2 foci were noted in the upper cervical spine on the 2018 brain MRI.  One of these was present on the 2011 cervical spine MRI.      No significant degenerative changes.  No nerve root compression or spinal stenosis.  01/18/2021  MRI of the brain 01/18/2021 showed multiple T2/FLAIR hyperintense foci in the hemispheres and  spinal cord in a pattern consistent with chronic demyelinating plaque associated with multiple sclerosis.  None of the foci enhanced or appear to be acute.  Compared to the MRI from 09/09/2018, there do not appear to be any new lesions.   REVIEW OF SYSTEMS: Constitutional: No fevers, chills, sweats, or change in appetite.   She has fatigue, daytime sleepiness and nighttime insomnia Eyes: No visual changes,  double vision, eye pain Ear, nose and throat: No hearing loss, ear pain, nasal congestion, sore throat Cardiovascular: No chest pain, palpitations Respiratory:  No shortness of breath at rest or with exertion.   No wheezes GastrointestinaI: No nausea, vomiting, diarrhea, abdominal pain, fecal incontinence Genitourinary:  No dysuria, urinary retention or frequency.  No nocturia. Musculoskeletal:  No neck pain, back pain Integumentary: No rash, pruritus, skin lesions Neurological: as above Psychiatric: As above Endocrine: No palpitations, diaphoresis, change in appetite, change in weigh or increased thirst Hematologic/Lymphatic:  No anemia, purpura, petechiae. Allergic/Immunologic: No itchy/runny eyes, nasal congestion, recent allergic reactions, rashes  ALLERGIES: No Known Allergies  HOME MEDICATIONS:  Current Outpatient Medications:    amLODipine (NORVASC) 5 MG tablet, Take 5 mg by mouth daily., Disp: , Rfl:    baclofen (LIORESAL) 10 MG tablet, Take 10 mg by mouth 3 (three) times daily as needed for muscle spasms., Disp: , Rfl:    levonorgestrel (MIRENA) 20 MCG/DAY IUD, 1 each by Intrauterine route once., Disp: , Rfl:    sertraline (ZOLOFT) 100 MG tablet, Take 1 tablet (100 mg total) by mouth daily., Disp: 90 tablet, Rfl: 3   TURMERIC-GINGER PO, Take 1 capsule by mouth daily., Disp: , Rfl:    valACYclovir (VALTREX) 500 MG tablet, Take one tablet twice daily as needed for cold sores., Disp: 30 tablet, Rfl: 0   Vitamin D-Vitamin K (VITAMIN K2-VITAMIN D3 PO), Take 1 capsule by mouth  daily., Disp: , Rfl:    ALPRAZolam (XANAX) 1 MG tablet, Take 1 tablet (1 mg total) by mouth at bedtime as needed for anxiety., Disp: 30 tablet, Rfl: 5   Armodafinil 250 MG tablet, Take 1 tablet (250 mg total) by mouth daily., Disp: 90 tablet, Rfl: 1   buPROPion (WELLBUTRIN XL) 300 MG 24 hr tablet, Take 1 tablet (300 mg total) by mouth every morning., Disp: 30 tablet, Rfl: 1   leflunomide (ARAVA) 20 MG tablet, Take 1 tablet (20 mg total) by mouth daily., Disp: 90 tablet, Rfl: 3  PAST MEDICAL HISTORY: Past Medical History:  Diagnosis Date   Anxiety    Depression    Hypertension    MS (multiple sclerosis) ([place])    Diagnosed in 2005   MS (multiple sclerosis) ([place])     PAST SURGICAL HISTORY: Past Surgical History:  Procedure Laterality Date   KNEE ARTHROSCOPY WITH MEDIAL MENISECTOMY Right 08/30/2020   Procedure: KNEE ARTHROSCOPY WITH MEDIAL MENISECTOMY WITH CYST DECOMPRESSION;  Surgeon: Hiram Gash, MD;  Location: [facility];  Service: Orthopedics;  Laterality: Right;   MOUTH SURGERY     WISDOM TOOTH EXTRACTION      FAMILY HISTORY: Family History  Problem Relation Age of Onset   Depression Mother     SOCIAL HISTORY:  Social History   Socioeconomic History   Marital status: Divorced    Spouse name: Not on file   Number of children: Not on file   Years of education: Not on file   Highest education level: Not on file  Occupational History   Not on file  Tobacco Use   Smoking status: Former    Types: Cigarettes   Smokeless tobacco: Never  Vaping Use   Vaping Use: Never used  Substance and Sexual Activity   Alcohol use: Yes    Alcohol/week: 0.0 standard drinks of alcohol    Comment: occasional/fim   Drug use: No   Sexual activity: Not on file  Other Topics Concern   Not on file  Social  History Narrative   Not on file   Social Determinants of Health   Financial Resource Strain: Not on file  Food Insecurity: Not on file  Transportation Needs: Not  on file  Physical Activity: Not on file  Stress: Not on file  Social Connections: Not on file  Intimate Partner Violence: Not on file     PHYSICAL EXAM  Vitals:   03/06/22 1042  BP: (!) 141/96  Pulse: 73  Weight: 221 lb (100.2 kg)  Height: 5' 5.5" (1.664 m)    Body mass index is 36.22 kg/m.   General: The patient is well-developed and well-nourished and in no acute distress.     Neurologic Exam  Mental status: The patient is alert and oriented x 3 at the time of the examination. The patient has apparent normal recent and remote memory, with an apparently normal attention span and concentration ability.   Speech with a few errors.  Cranial nerves: Extraocular movements are full.  Color vision was symmetric.  Facial strength and sensation was normal.. No obvious hearing deficits are noted.  Motor:  Muscle bulk is normal.   Tone is normal. Strength is  5 / 5 in all 4 extremities.   Sensory: Intact sensation to touch and vibration in the limbs.  Coordination: Cerebellar testing reveals good finger-nose-finger.  Gait and station: Station is normal.   Gait is mildly wide.  The tandem gait is wide.  Romberg is negative. Reflexes: Deep tendon reflexes are symmetric.   Leg DTRs are brisk with spread at the knees but no ankle clonus.  .      ASSESSMENT AND PLAN  Multiple sclerosis ([place]) - Plan: MR BRAIN W WO CONTRAST, Comprehensive metabolic panel, CBC with Differential/Platelet, leflunomide (ARAVA) 20 MG tablet  High risk medication use - Plan: Comprehensive metabolic panel, CBC with Differential/Platelet  Vitamin D deficiency - Plan: VITAMIN D 25 Hydroxy (Vit-D Deficiency, Fractures)  Hypersomnia, idiopathic  Other fatigue  Gait disturbance  Depression, unspecified depression type  History of optic neuritis   1.   For MS, continue leflunomide.  Check labs.  Check MRI of the brain to determine if there is any subclinical progression.  If this is occurring I would  recommend we switch to an anti-CD20 agent (she is JCV high titer positive so cannot go on Tysabri.   2.   Nuvigil for excessive daytime sleepiness due to narcolepsy or idiopathic hypersomnia .     Prozac and add back Wellbutrin for mood 3.  We discussed trial of protriptyline if dream issues persist and/or Wakix for daytime sleepiness. 4.    She will return to see me in 6 months if stable or call sooner if she is having new or worsening neurologic symptoms.  41 minute office visit with the majority of the time spent face-to-face for history and physical, discussion/counseling and decision-making.  Additional time with record review and documentation.   Zia Kanner A. Felecia Shelling, MD, PhD 3/33/5456, 25:63 PM Certified in Neurology, Clinical Neurophysiology, Sleep Medicine, Pain Medicine and Neuroimaging  [facility] [address], [place], [place] [address] [phone]

## 2022-03-06 NOTE — Patient Instructions (Signed)
Sertraline 100 mg Take 1/2 pill daily the first week, then increase to one pill daily

## 2022-03-06 NOTE — Telephone Encounter (Signed)
Healthteam advantage NPR sent to GI [phone] 

## 2022-03-07 ENCOUNTER — Other Ambulatory Visit: Payer: Self-pay | Admitting: Neurology

## 2022-03-07 LAB — CBC WITH DIFFERENTIAL/PLATELET
Basophils Absolute: 0 10*3/uL (ref 0.0–0.2)
Basos: 0 %
EOS (ABSOLUTE): 0.1 10*3/uL (ref 0.0–0.4)
Eos: 2 %
Hematocrit: 41.6 % (ref 34.0–46.6)
Hemoglobin: 14.3 g/dL (ref 11.1–15.9)
Immature Grans (Abs): 0 10*3/uL (ref 0.0–0.1)
Immature Granulocytes: 0 %
Lymphocytes Absolute: 2 10*3/uL (ref 0.7–3.1)
Lymphs: 31 %
MCH: 32.6 pg (ref 26.6–33.0)
MCHC: 34.4 g/dL (ref 31.5–35.7)
MCV: 95 fL (ref 79–97)
Monocytes Absolute: 0.6 10*3/uL (ref 0.1–0.9)
Monocytes: 9 %
Neutrophils Absolute: 3.8 10*3/uL (ref 1.4–7.0)
Neutrophils: 58 %
Platelets: 238 10*3/uL (ref 150–450)
RBC: 4.39 x10E6/uL (ref 3.77–5.28)
RDW: 11.9 % (ref 11.7–15.4)
WBC: 6.6 10*3/uL (ref 3.4–10.8)

## 2022-03-07 LAB — COMPREHENSIVE METABOLIC PANEL
ALT: 25 IU/L (ref 0–32)
AST: 17 IU/L (ref 0–40)
Albumin/Globulin Ratio: 1.8 (ref 1.2–2.2)
Albumin: 4.5 g/dL (ref 3.9–4.9)
Alkaline Phosphatase: 89 IU/L (ref 44–121)
BUN/Creatinine Ratio: 14 (ref 9–23)
BUN: 9 mg/dL (ref 6–24)
Bilirubin Total: 0.4 mg/dL (ref 0.0–1.2)
CO2: 23 mmol/L (ref 20–29)
Calcium: 9.6 mg/dL (ref 8.7–10.2)
Chloride: 102 mmol/L (ref 96–106)
Creatinine, Ser: 0.64 mg/dL (ref 0.57–1.00)
Globulin, Total: 2.5 g/dL (ref 1.5–4.5)
Glucose: 80 mg/dL (ref 70–99)
Potassium: 4.3 mmol/L (ref 3.5–5.2)
Sodium: 141 mmol/L (ref 134–144)
Total Protein: 7 g/dL (ref 6.0–8.5)
eGFR: 111 mL/min/{1.73_m2} (ref >59)

## 2022-03-07 LAB — VITAMIN D 25 HYDROXY (VIT D DEFICIENCY, FRACTURES): Vit D, 25-Hydroxy: 20.2 ng/mL — ABNORMAL LOW (ref 30.0–100.0)

## 2022-03-07 MED ORDER — VITAMIN D (ERGOCALCIFEROL) 1.25 MG (50000 UNIT) PO CAPS: 50000.0000 [IU] | ORAL_CAPSULE | ORAL | 1 refills | Status: DC

## 2022-04-26 ENCOUNTER — Other Ambulatory Visit: Payer: Self-pay

## 2022-04-26 ENCOUNTER — Encounter (HOSPITAL_BASED_OUTPATIENT_CLINIC_OR_DEPARTMENT_OTHER): Payer: Self-pay | Admitting: Orthopaedic Surgery

## 2022-04-29 NOTE — H&P (Signed)
PREOPERATIVE H&P  Chief Complaint: LEFT KNEE MEDIAL MENISCUS TEAR  HPI: [name] is a 47 y.o. female who is scheduled for, Procedure(s): KNEE ARTHROSCOPY WITH MEDIAL MENISECTOMY.   Patient has a past medical history significant for MS and HTN.   The patient had a medial meniscus on her right knee by Dr. Griffin Basil in 2022. She has medial based pain on the left knee which feels just like her right knee did, she states. She has had pain with deep flexion. She has the sense of her knee giving out. She has an occasional swelling. She has instability reported. She had an injury a few years ago when she was twisting and felt a pop.   Symptoms are rated as moderate to severe, and have been worsening.  This is significantly impairing activities of daily living.    Please see clinic note for further details on this patient's care.    She has elected for surgical management.   Past Medical History:  Diagnosis Date   Anxiety    Depression    Hypertension    MS (multiple sclerosis) ([place])    Diagnosed in 2005   MS (multiple sclerosis) ([place])    Past Surgical History:  Procedure Laterality Date   KNEE ARTHROSCOPY WITH MEDIAL MENISECTOMY Right 08/30/2020   Procedure: KNEE ARTHROSCOPY WITH MEDIAL MENISECTOMY WITH CYST DECOMPRESSION;  Surgeon: Hiram Gash, MD;  Location: [facility];  Service: Orthopedics;  Laterality: Right;   MOUTH SURGERY     WISDOM TOOTH EXTRACTION     Social History   Socioeconomic History   Marital status: Divorced    Spouse name: Not on file   Number of children: Not on file   Years of education: Not on file   Highest education level: Not on file  Occupational History   Not on file  Tobacco Use   Smoking status: Former    Types: Cigarettes   Smokeless tobacco: Never  Vaping Use   Vaping Use: Never used  Substance and Sexual Activity   Alcohol use: Yes    Alcohol/week: 0.0 standard drinks of alcohol    Comment: occasional/fim   Drug  use: No   Sexual activity: Not on file  Other Topics Concern   Not on file  Social History Narrative   Not on file   Social Determinants of Health   Financial Resource Strain: Not on file  Food Insecurity: Not on file  Transportation Needs: Not on file  Physical Activity: Not on file  Stress: Not on file  Social Connections: Not on file   Family History  Problem Relation Age of Onset   Depression Mother    No Known Allergies Prior to Admission medications   Medication Sig Start Date End Date Taking? Authorizing Provider  ALPRAZolam Duanne Moron) 1 MG tablet Take 1 tablet (1 mg total) by mouth at bedtime as needed for anxiety. 03/06/22  Yes Sater, Nanine Means, MD  amLODipine (NORVASC) 5 MG tablet Take 5 mg by mouth daily.   Yes [provider]  leflunomide (ARAVA) 20 MG tablet Take 1 tablet (20 mg total) by mouth daily. 03/06/22  Yes Sater, Nanine Means, MD  levonorgestrel (MIRENA) 20 MCG/DAY IUD 1 each by Intrauterine route once.   Yes [provider]  sertraline (ZOLOFT) 100 MG tablet Take 1 tablet (100 mg total) by mouth daily. 03/06/22  Yes Sater, Nanine Means, MD  valACYclovir (VALTREX) 500 MG tablet Take one tablet twice daily as needed for  cold sores. 08/08/16  Yes Sater, Nanine Means, MD  Vitamin D, Ergocalciferol, (DRISDOL) 1.25 MG (50000 UNIT) CAPS capsule Take 1 capsule (50,000 Units total) by mouth every 7 (seven) days. 03/07/22  Yes Sater, Nanine Means, MD  Armodafinil 250 MG tablet Take 1 tablet (250 mg total) by mouth daily. 03/06/22   Sater, Nanine Means, MD    ROS: All other systems have been reviewed and were otherwise negative with the exception of those mentioned in the HPI and as above.  Physical Exam: General: Alert, no acute distress Cardiovascular: No pedal edema Respiratory: No cyanosis, no use of accessory musculature GI: No organomegaly, abdomen is soft and non-tender Skin: No lesions in the area of chief complaint Neurologic: Sensation intact  distally Psychiatric: Patient is competent for consent with normal mood and affect Lymphatic: No axillary or cervical lymphadenopathy  MUSCULOSKELETAL:  Range of motion of the knee is intact.  She has medial tenderness to palpation.  Imaging: MRI demonstrates significant bone infarcts.  Some near the articular surface of the medial side; however, there is a complete medial meniscus tear.  Assessment: LEFT KNEE MEDIAL MENISCUS TEAR  Plan: Plan for Procedure(s): KNEE ARTHROSCOPY WITH MEDIAL MENISECTOMY  The risks benefits and alternatives were discussed with the patient including but not limited to the risks of nonoperative treatment, versus surgical intervention including infection, bleeding, nerve injury,  blood clots, cardiopulmonary complications, morbidity, mortality, among others, and they were willing to proceed.   The patient acknowledged the explanation, agreed to proceed with the plan and consent was signed.   Operative Plan: Left knee scope with medial meniscectomy  Discharge Medications: Standard DVT Prophylaxis: Aspirin Physical Therapy: Outpatient PT Special Discharge needs: +/-   Ethelda Chick, PA-C  04/29/2022 11:16 AM

## 2022-04-30 ENCOUNTER — Encounter (HOSPITAL_BASED_OUTPATIENT_CLINIC_OR_DEPARTMENT_OTHER)
Admission: RE | Admit: 2022-04-30 | Discharge: 2022-04-30 | Disposition: A | Discharge status: Home / Self Care | Payer: PPO | Source: Ambulatory Visit | Attending: Orthopaedic Surgery | Admitting: Orthopaedic Surgery

## 2022-04-30 DIAGNOSIS — I10 Essential (primary) hypertension: Secondary | ICD-10-CM | POA: Diagnosis not present

## 2022-04-30 DIAGNOSIS — Z01810 Encounter for preprocedural cardiovascular examination: Secondary | ICD-10-CM | POA: Insufficient documentation

## 2022-04-30 NOTE — Progress Notes (Signed)
Surgical soap given with instructions, pt verbalized understanding.  

## 2022-05-02 ENCOUNTER — Ambulatory Visit (HOSPITAL_BASED_OUTPATIENT_CLINIC_OR_DEPARTMENT_OTHER)
Admission: RE | Admit: 2022-05-02 | Discharge: 2022-05-02 | Disposition: A | Discharge status: Home / Self Care | Payer: PPO | Attending: Orthopaedic Surgery | Admitting: Orthopaedic Surgery

## 2022-05-02 ENCOUNTER — Encounter (HOSPITAL_BASED_OUTPATIENT_CLINIC_OR_DEPARTMENT_OTHER): Payer: Self-pay | Admitting: Orthopaedic Surgery

## 2022-05-02 ENCOUNTER — Ambulatory Visit (HOSPITAL_BASED_OUTPATIENT_CLINIC_OR_DEPARTMENT_OTHER): Payer: PPO | Admitting: Certified Registered"

## 2022-05-02 ENCOUNTER — Other Ambulatory Visit: Payer: Self-pay

## 2022-05-02 ENCOUNTER — Encounter (HOSPITAL_BASED_OUTPATIENT_CLINIC_OR_DEPARTMENT_OTHER)
Admission: RE | Disposition: A | Discharge status: Home / Self Care | Payer: Self-pay | Source: Home / Self Care | Attending: Orthopaedic Surgery

## 2022-05-02 DIAGNOSIS — S83242A Other tear of medial meniscus, current injury, left knee, initial encounter: Secondary | ICD-10-CM | POA: Insufficient documentation

## 2022-05-02 DIAGNOSIS — S83242D Other tear of medial meniscus, current injury, left knee, subsequent encounter: Secondary | ICD-10-CM

## 2022-05-02 DIAGNOSIS — Z87891 Personal history of nicotine dependence: Secondary | ICD-10-CM

## 2022-05-02 DIAGNOSIS — F419 Anxiety disorder, unspecified: Secondary | ICD-10-CM | POA: Diagnosis not present

## 2022-05-02 DIAGNOSIS — I10 Essential (primary) hypertension: Secondary | ICD-10-CM | POA: Insufficient documentation

## 2022-05-02 DIAGNOSIS — X501XXA Overexertion from prolonged static or awkward postures, initial encounter: Secondary | ICD-10-CM | POA: Diagnosis not present

## 2022-05-02 DIAGNOSIS — F32A Depression, unspecified: Secondary | ICD-10-CM | POA: Insufficient documentation

## 2022-05-02 DIAGNOSIS — F418 Other specified anxiety disorders: Secondary | ICD-10-CM | POA: Diagnosis not present

## 2022-05-02 DIAGNOSIS — G35 Multiple sclerosis: Secondary | ICD-10-CM | POA: Diagnosis not present

## 2022-05-02 HISTORY — PX: KNEE ARTHROSCOPY WITH MEDIAL MENISECTOMY: SHX5651

## 2022-05-02 SURGERY — ARTHROSCOPY, KNEE, WITH MEDIAL MENISCECTOMY
Anesthesia: General | Site: Knee | Laterality: Left

## 2022-05-02 MED ORDER — ATROPINE SULFATE 0.4 MG/ML IV SOLN
INTRAVENOUS | Status: AC
  Filled 2022-05-02: qty 1

## 2022-05-02 MED ORDER — ACETAMINOPHEN 500 MG PO TABS
ORAL_TABLET | ORAL | Status: AC
  Filled 2022-05-02: qty 2

## 2022-05-02 MED ORDER — FENTANYL CITRATE (PF) 100 MCG/2ML IJ SOLN
INTRAMUSCULAR | Status: DC | PRN
  Administered 2022-05-02: 50 ug via INTRAVENOUS

## 2022-05-02 MED ORDER — ONDANSETRON HCL 4 MG/2ML IJ SOLN
INTRAMUSCULAR | Status: DC | PRN
  Administered 2022-05-02: 4 mg via INTRAVENOUS

## 2022-05-02 MED ORDER — CEFAZOLIN SODIUM-DEXTROSE 2-4 GM/100ML-% IV SOLN
INTRAVENOUS | Status: AC
  Filled 2022-05-02: qty 100

## 2022-05-02 MED ORDER — LIDOCAINE 2% (20 MG/ML) 5 ML SYRINGE
INTRAMUSCULAR | Status: AC
  Filled 2022-05-02: qty 5

## 2022-05-02 MED ORDER — FENTANYL CITRATE (PF) 100 MCG/2ML IJ SOLN: 25.0000 ug | INTRAMUSCULAR | Status: DC | PRN

## 2022-05-02 MED ORDER — ACETAMINOPHEN 500 MG PO TABS
1000.0000 mg | ORAL_TABLET | Freq: Once | ORAL | Status: AC
  Administered 2022-05-02: 1000 mg via ORAL

## 2022-05-02 MED ORDER — DEXAMETHASONE SODIUM PHOSPHATE 10 MG/ML IJ SOLN
INTRAMUSCULAR | Status: DC | PRN
  Administered 2022-05-02: 10 mg via INTRAVENOUS

## 2022-05-02 MED ORDER — OXYCODONE HCL 5 MG PO TABS: ORAL_TABLET | ORAL | 0 refills | Status: AC

## 2022-05-02 MED ORDER — ACETAMINOPHEN 500 MG PO TABS: 1000.0000 mg | ORAL_TABLET | Freq: Three times a day (TID) | ORAL | 0 refills | Status: AC

## 2022-05-02 MED ORDER — DEXAMETHASONE SODIUM PHOSPHATE 10 MG/ML IJ SOLN
INTRAMUSCULAR | Status: AC
  Filled 2022-05-02: qty 1

## 2022-05-02 MED ORDER — FENTANYL CITRATE (PF) 100 MCG/2ML IJ SOLN
INTRAMUSCULAR | Status: AC
  Filled 2022-05-02: qty 2

## 2022-05-02 MED ORDER — LIDOCAINE HCL (CARDIAC) PF 100 MG/5ML IV SOSY
PREFILLED_SYRINGE | INTRAVENOUS | Status: DC | PRN
  Administered 2022-05-02: 80 mg via INTRAVENOUS

## 2022-05-02 MED ORDER — MELOXICAM 15 MG PO TABS: 15.0000 mg | ORAL_TABLET | Freq: Every day | ORAL | 0 refills | Status: AC

## 2022-05-02 MED ORDER — PROMETHAZINE HCL 25 MG/ML IJ SOLN: 6.2500 mg | INTRAMUSCULAR | Status: DC | PRN

## 2022-05-02 MED ORDER — LACTATED RINGERS IV SOLN: INTRAVENOUS | Status: DC

## 2022-05-02 MED ORDER — ASPIRIN 81 MG PO CHEW: 81.0000 mg | CHEWABLE_TABLET | Freq: Two times a day (BID) | ORAL | 0 refills | Status: AC

## 2022-05-02 MED ORDER — SODIUM CHLORIDE 0.9 % IR SOLN
Status: DC | PRN
  Administered 2022-05-02: 3000 mL

## 2022-05-02 MED ORDER — BUPIVACAINE HCL (PF) 0.25 % IJ SOLN
INTRAMUSCULAR | Status: DC | PRN
  Administered 2022-05-02: 20 mL

## 2022-05-02 MED ORDER — GABAPENTIN 300 MG PO CAPS
300.0000 mg | ORAL_CAPSULE | Freq: Once | ORAL | Status: AC
  Administered 2022-05-02: 300 mg via ORAL

## 2022-05-02 MED ORDER — ONDANSETRON HCL 4 MG PO TABS: 4.0000 mg | ORAL_TABLET | Freq: Three times a day (TID) | ORAL | 0 refills | Status: AC | PRN

## 2022-05-02 MED ORDER — BUPIVACAINE HCL (PF) 0.25 % IJ SOLN
INTRAMUSCULAR | Status: AC
  Filled 2022-05-02: qty 30

## 2022-05-02 MED ORDER — CEFAZOLIN SODIUM-DEXTROSE 2-4 GM/100ML-% IV SOLN
2.0000 g | INTRAVENOUS | Status: AC
  Administered 2022-05-02: 2 g via INTRAVENOUS

## 2022-05-02 MED ORDER — ONDANSETRON HCL 4 MG/2ML IJ SOLN
INTRAMUSCULAR | Status: AC
  Filled 2022-05-02: qty 2

## 2022-05-02 MED ORDER — PROPOFOL 10 MG/ML IV BOLUS
INTRAVENOUS | Status: AC
  Filled 2022-05-02: qty 20

## 2022-05-02 MED ORDER — MIDAZOLAM HCL 2 MG/2ML IJ SOLN
INTRAMUSCULAR | Status: AC
  Filled 2022-05-02: qty 2

## 2022-05-02 MED ORDER — KETOROLAC TROMETHAMINE 30 MG/ML IJ SOLN
INTRAMUSCULAR | Status: DC | PRN
  Administered 2022-05-02: 30 mg via INTRAVENOUS

## 2022-05-02 MED ORDER — VANCOMYCIN HCL 1000 MG IV SOLR
INTRAVENOUS | Status: AC
  Filled 2022-05-02: qty 20

## 2022-05-02 MED ORDER — PROPOFOL 10 MG/ML IV BOLUS
INTRAVENOUS | Status: DC | PRN
  Administered 2022-05-02: 170 mg via INTRAVENOUS
  Administered 2022-05-02: 30 mg via INTRAVENOUS

## 2022-05-02 MED ORDER — MIDAZOLAM HCL 5 MG/5ML IJ SOLN
INTRAMUSCULAR | Status: DC | PRN
  Administered 2022-05-02: 2 mg via INTRAVENOUS

## 2022-05-02 MED ORDER — GABAPENTIN 300 MG PO CAPS
ORAL_CAPSULE | ORAL | Status: AC
  Filled 2022-05-02: qty 1

## 2022-05-02 MED ORDER — KETOROLAC TROMETHAMINE 30 MG/ML IJ SOLN
INTRAMUSCULAR | Status: AC
  Filled 2022-05-02: qty 1

## 2022-05-02 SURGICAL SUPPLY — 34 items
APL PRP STRL LF DISP 70% ISPRP (MISCELLANEOUS) ×1
BANDAGE ESMARK 6X9 LF (GAUZE/BANDAGES/DRESSINGS) IMPLANT
BNDG CMPR 5X62 HK CLSR LF (GAUZE/BANDAGES/DRESSINGS) ×1
BNDG CMPR 6"X 5 YARDS HK CLSR (GAUZE/BANDAGES/DRESSINGS) ×1
BNDG CMPR 9X6 STRL LF SNTH (GAUZE/BANDAGES/DRESSINGS)
BNDG ELASTIC 6INX 5YD STR LF (GAUZE/BANDAGES/DRESSINGS) ×1 IMPLANT
BNDG ESMARK 6X9 LF (GAUZE/BANDAGES/DRESSINGS)
CHLORAPREP W/TINT 26 (MISCELLANEOUS) ×1 IMPLANT
CLSR STERI-STRIP ANTIMIC 1/2X4 (GAUZE/BANDAGES/DRESSINGS) ×1 IMPLANT
CUFF TOURN SGL QUICK 34 (TOURNIQUET CUFF) ×1
CUFF TRNQT CYL 34X4.125X (TOURNIQUET CUFF) ×1 IMPLANT
DISSECTOR 4.0MMX13CM CVD (MISCELLANEOUS) ×1 IMPLANT
DRAPE ARTHROSCOPY W/POUCH 90 (DRAPES) ×1 IMPLANT
DRAPE IMP U-DRAPE 54X76 (DRAPES) ×1 IMPLANT
DRAPE U-SHAPE 47X51 STRL (DRAPES) ×1 IMPLANT
GAUZE SPONGE 4X4 12PLY STRL (GAUZE/BANDAGES/DRESSINGS) ×1 IMPLANT
GLOVE BIO SURGEON STRL SZ 6.5 (GLOVE) ×1 IMPLANT
GLOVE BIOGEL PI IND STRL 6.5 (GLOVE) ×1 IMPLANT
GLOVE BIOGEL PI IND STRL 8 (GLOVE) ×1 IMPLANT
GLOVE ECLIPSE 8.0 STRL XLNG CF (GLOVE) ×2 IMPLANT
GOWN STRL REUS W/ TWL LRG LVL3 (GOWN DISPOSABLE) ×1 IMPLANT
GOWN STRL REUS W/TWL LRG LVL3 (GOWN DISPOSABLE) ×1
GOWN STRL REUS W/TWL XL LVL3 (GOWN DISPOSABLE) ×1 IMPLANT
KIT TURNOVER KIT B (KITS) ×1 IMPLANT
MANIFOLD NEPTUNE II (INSTRUMENTS) IMPLANT
NS IRRIG 1000ML POUR BTL (IV SOLUTION) IMPLANT
PACK ARTHROSCOPY DSU (CUSTOM PROCEDURE TRAY) ×1 IMPLANT
PORT APPOLLO RF 90DEGREE MULTI (SURGICAL WAND) IMPLANT
SLEEVE SCD COMPRESS KNEE MED (STOCKING) ×1 IMPLANT
SUT MNCRL AB 4-0 PS2 18 (SUTURE) ×1 IMPLANT
TOWEL GREEN STERILE FF (TOWEL DISPOSABLE) ×1 IMPLANT
TUBE CONNECTING 20X1/4 (TUBING) ×1 IMPLANT
TUBING ARTHROSCOPY IRRIG 16FT (MISCELLANEOUS) ×1 IMPLANT
WATER STERILE IRR 1000ML POUR (IV SOLUTION) ×1 IMPLANT

## 2022-05-02 NOTE — Discharge Instructions (Addendum)
Ophelia Charter MD, MPH Noemi Chapel, PA-C [facility] [address], [address] [phone] (tel)   [phone] (fax)   POST-OPERATIVE INSTRUCTIONS - Knee Arthroscopy  WOUND CARE - You may remove the Operative Dressing on Post-Op Day #3 (72hrs after surgery).   -  Alternatively if you would like you can leave dressing on until follow-up if within 7-8 days but keep it dry. - Leave steri-strips in place until they fall off on their own, usually 2 weeks postop. - An ACE wrap may be used to control swelling, do not wrap this too tight.  If the initial ACE wrap feels too tight you may loosen it. - There may be a small amount of fluid/bleeding leaking at the surgical site.  - This is normal; the knee is filled with fluid during the procedure and can leak for 24-48hrs after surgery. You may change/reinforce the bandage as needed.  - Use the Cryocuff or Ice as often as possible for the first 7 days, then as needed for pain relief. Always keep a towel, ACE wrap or other barrier between the cooling unit and your skin.  - You may shower on Post-Op Day #3. Gently pat the area dry.  - Do not soak the knee in water or submerge it.  - Do not go swimming in the pool or ocean until 4 weeks after surgery or when otherwise instructed.  Keep dry incisions as dry as possible.   BRACE/AMBULATION  -            You will not need a brace after this procedure.   - You may use crutches initially to help you weight bear, but this is not required - You can put full weight on your operative leg as you feel comfortable  PHYSICAL THERAPY - You will begin physical therapy soon after surgery (unless otherwise specified) - Please call to set up an appointment, if you do not already have one  - Let our office if there are any issues with scheduling your therapy  - Our office will call you to schedule post-op physical therapy  REGIONAL ANESTHESIA (NERVE BLOCKS) The anesthesia team may have  performed a nerve block for you this is a great tool used to minimize pain.   The block may start wearing off overnight (between 8-24 hours postop) When the block wears off, your pain may go from nearly zero to the pain you would have had postop without the block. This is an abrupt transition but nothing dangerous is happening.   This can be a challenging period but utilize your as needed pain medications to try and manage this period. We suggest you use the pain medication the first night prior to going to bed, to ease this transition.  You may take an extra dose of narcotic when this happens if needed   POST-OP MEDICATIONS- Multimodal approach to pain control In general your pain will be controlled with a combination of substances.  Prescriptions unless otherwise discussed are electronically sent to your pharmacy.  This is a carefully made plan we use to minimize narcotic use.     Meloxicam - Anti-inflammatory medication taken on a scheduled basis Acetaminophen - Non-narcotic pain medicine taken on a scheduled basis  Oxycodone - This is a strong narcotic, to be used only on an "as needed" basis for SEVERE pain. Aspirin 81mg  - This medicine is used to minimize the risk of blood clots after surgery. Zofran - take as needed for nausea  FOLLOW-UP   Please call the office to schedule a follow-up appointment for your incision check, 7-10 days post-operatively.   IF YOU HAVE ANY QUESTIONS, PLEASE FEEL FREE TO CALL OUR OFFICE.   HELPFUL INFORMATION   Keep your leg elevated to decrease swelling, which will then in turn decrease your pain. I would elevate the foot of your bed by putting a couple of couch pillows between your mattress and box spring. I would not keep pillow directly under your ankle.  - Do not sleep with a pillow behind your knee even if it is more comfortable as this may make it harder to get your knee fully straight long term.   There will be MORE swelling on days 1-3  than there is on the day of surgery.  This also is normal. The swelling will decrease with the anti-inflammatory medication, ice and keeping it elevated. The swelling will make it more difficult to bend your knee. As the swelling goes down your motion will become easier   You may develop swelling and bruising that extends from your knee down to your calf and perhaps even to your foot over the next week. Do not be alarmed. This too is normal, and it is due to gravity   There may be some numbness adjacent to the incision site. This may last for 6-12 months or longer in some patients and is expected.   You may return to sedentary work/school in the next couple of days when you feel up to it. You will need to keep your leg elevated as much as possible    You should wean off your narcotic medicines as soon as you are able.  Most patients will be off or using minimal narcotics before their first postop appointment.    We suggest you use the pain medication the first night prior to going to bed, in order to ease any pain when the anesthesia wears off. You should avoid taking pain medications on an empty stomach as it will make you nauseous.   Do not drink alcoholic beverages or take illicit drugs when taking pain medications.   It is against the law to drive while taking narcotics. You cannot drive if your Right leg is in brace locked in extension.   Pain medication may make you constipated.  Below are a few solutions to try in this order:  o Decrease the amount of pain medication if you aren't having pain.  o Drink lots of decaffeinated fluids.  o Drink prune juice and/or eat dried prunes   o If the first 3 don't work start with additional solutions  o Take Colace - an over-the-counter stool softener  o Take Senokot - an over-the-counter laxative  o Take Miralax - a stronger over-the-counter laxative    For more information including helpful videos and documents visit our website:    [email or web address]   No Tylenol until after 12:30pm today, if needed. No Ibuprofen until after 2:00pm today, if needed.   Post Anesthesia Home Care Instructions  Activity: Get plenty of rest for the remainder of the day. A responsible individual must stay with you for 24 hours following the procedure.  For the next 24 hours, DO NOT: -Drive a car -Paediatric nurse -Drink alcoholic beverages -Take any medication unless instructed by your physician -Make any legal decisions or sign important papers.  Meals: Start with liquid foods such as gelatin or soup. Progress to regular foods as tolerated. Avoid greasy, spicy, heavy foods. If nausea and/or  vomiting occur, drink only clear liquids until the nausea and/or vomiting subsides. Call your physician if vomiting continues.  Special Instructions/Symptoms: Your throat may feel dry or sore from the anesthesia or the breathing tube placed in your throat during surgery. If this causes discomfort, gargle with warm salt water. The discomfort should disappear within 24 hours.  If you had a scopolamine patch placed behind your ear for the management of post- operative nausea and/or vomiting:  1. The medication in the patch is effective for 72 hours, after which it should be removed.  Wrap patch in a tissue and discard in the trash. Wash hands thoroughly with soap and water. 2. You may remove the patch earlier than 72 hours if you experience unpleasant side effects which may include dry mouth, dizziness or visual disturbances. 3. Avoid touching the patch. Wash your hands with soap and water after contact with the patch.

## 2022-05-02 NOTE — Anesthesia Procedure Notes (Signed)
Procedure Name: LMA Insertion Date/Time: 05/02/2022 7:43 AM  Performed by: Lavonia Dana, CRNAPre-anesthesia Checklist: Patient identified, Emergency Drugs available, Suction available and Patient being monitored Patient Re-evaluated:Patient Re-evaluated prior to induction Oxygen Delivery Method: Circle system utilized Preoxygenation: Pre-oxygenation with 100% oxygen Induction Type: IV induction Ventilation: Mask ventilation without difficulty LMA: LMA inserted LMA Size: 4.0 Number of attempts: 1 Airway Equipment and Method: Bite block Placement Confirmation: positive ETCO2 Tube secured with: Tape Dental Injury: Teeth and Oropharynx as per pre-operative assessment

## 2022-05-02 NOTE — Interval H&P Note (Signed)
All questions answered

## 2022-05-02 NOTE — Anesthesia Preprocedure Evaluation (Addendum)
Anesthesia Evaluation  Patient identified by MRN, date of birth, ID band Patient awake    Reviewed: Allergy & Precautions, NPO status , Patient's Chart, lab work & pertinent test results  Airway Mallampati: II  TM Distance: >3 FB Neck ROM: Full    Dental  (+) Teeth Intact, Dental Advisory Given   Pulmonary former smoker   Pulmonary exam normal breath sounds clear to auscultation       Cardiovascular hypertension, Pt. on medications Normal cardiovascular exam Rhythm:Regular Rate:Normal     Neuro/Psych  Headaches PSYCHIATRIC DISORDERS Anxiety Depression    MS    GI/Hepatic negative GI ROS, Neg liver ROS,,,  Endo/Other  negative endocrine ROS    Renal/GU negative Renal ROS     Musculoskeletal LEFT KNEE MEDIAL MENISCUS TEAR   Abdominal   Peds  Hematology negative hematology ROS (+)   Anesthesia Other Findings Day of surgery medications reviewed with the patient.  Reproductive/Obstetrics negative OB ROS                             Anesthesia Physical Anesthesia Plan  ASA: 3  Anesthesia Plan: General   Post-op Pain Management: Tylenol PO (pre-op)* and Toradol IV (intra-op)*   Induction: Intravenous  PONV Risk Score and Plan: 3 and Midazolam, Dexamethasone and Ondansetron  Airway Management Planned: LMA  Additional Equipment:   Intra-op Plan:   Post-operative Plan: Extubation in OR  Informed Consent: I have reviewed the patients History and Physical, chart, labs and discussed the procedure including the risks, benefits and alternatives for the proposed anesthesia with the patient or authorized representative who has indicated his/her understanding and acceptance.     Dental advisory given  Plan Discussed with: CRNA  Anesthesia Plan Comments:        Anesthesia Quick Evaluation

## 2022-05-02 NOTE — Anesthesia Postprocedure Evaluation (Signed)
Anesthesia Post Note  Patient: [name]  Procedure(s) Performed: KNEE ARTHROSCOPY WITH MEDIAL MENISECTOMY (Left: Knee)     Patient location during evaluation: PACU Anesthesia Type: General Level of consciousness: awake and alert Pain management: pain level controlled Vital Signs Assessment: post-procedure vital signs reviewed and stable Respiratory status: spontaneous breathing, nonlabored ventilation, respiratory function stable and patient connected to nasal cannula oxygen Cardiovascular status: blood pressure returned to baseline and stable Postop Assessment: no apparent nausea or vomiting Anesthetic complications: no   No notable events documented.  Last Vitals:  Vitals:   05/02/22 0830 05/02/22 0840  BP: (!) 138/94 (!) 147/97  Pulse: (!) 59 73  Resp: 17 18  Temp:  (!) 36.3 C  SpO2: 98% 96%    Last Pain:  Vitals:   05/02/22 0840  TempSrc:   PainSc: 0-No pain                 Santa Lighter

## 2022-05-02 NOTE — Transfer of Care (Signed)
Immediate Anesthesia Transfer of Care Note  Patient: [name]  Procedure(s) Performed: KNEE ARTHROSCOPY WITH MEDIAL MENISECTOMY (Left: Knee)  Patient Location: PACU  Anesthesia Type:General  Level of Consciousness: awake, alert , and oriented  Airway & Oxygen Therapy: Patient Spontanous Breathing and Patient connected to face mask oxygen  Post-op Assessment: Report given to RN and Post -op Vital signs reviewed and stable  Post vital signs: Reviewed and stable  Last Vitals:  Vitals Value Taken Time  BP 133/95 (107)   Temp    Pulse 70   Resp 18   SpO2 99     Last Pain:  Vitals:   05/02/22 0632  TempSrc: Oral  PainSc: 2       Patients Stated Pain Goal: 3 ([id number])  Complications: No notable events documented.

## 2022-05-02 NOTE — Op Note (Signed)
Orthopaedic Surgery Operative Note (CSN: [id number])  [name]  [birth year] Date of Surgery: 05/02/2022   Diagnoses:  LEFT KNEE MEDIAL MENISCUS TEAR  Procedure: Left partial medial meniscectomy and 2 compartment synovectomy   Operative Finding Exam under anesthesia: Full motion limitation no instability Suprapatellar pouch: Redness but otherwise normal Patellofemoral Compartment: Normal Medial Compartment: Complex posterior medial meniscus tear involving about 30% total meniscal volume. Lateral Compartment: Normal Intercondylar Notch: Normal  Successful completion of the planned procedure.    Post-operative plan: The patient will be weightbearing to tolerance.  The patient will be charged home.  DVT prophylaxis Aspirin 81 mg twice daily for 6 weeks.  Pain control with PRN pain medication preferring oral medicines.  Follow up plan will be scheduled in approximately 7 days for incision check and XR.  Post-Op Diagnosis: Same Surgeons:Primary: Hiram Gash, MD Assistants:Caroline McBane PA-C Location: [facility] OR ROOM 1 Anesthesia: General with local Antibiotics: Ancef 2 g Tourniquet time: none Estimated Blood Loss: Minimal Complications: None Specimens: None Implants: * No implants in log *  Indications for Surgery:   [name] is a 47 y.o. female with medial meniscus tear demonstrated on MRI.  Benefits and risks of operative and nonoperative management were discussed prior to surgery with patient/guardian(s) and informed consent form was completed.  Specific risks including infection, need for additional surgery,    Procedure:   The patient was identified properly. Informed consent was obtained and the surgical site was marked. The patient was taken up to suite where general anesthesia was induced. The patient was placed in the supine position with a post against the surgical leg and a nonsterile tourniquet applied. The surgical leg was then prepped and draped usual  sterile fashion.  A standard surgical timeout was performed.  2 standard anterior portals were made and diagnostic arthroscopy performed. Please note the findings as noted above.  Using a shaver and basket we debrided the meniscus back to stable base, loose fragments were removed.  Synovectomy was performed of the anterior, medial and retropatellar synovium  Incisions closed with absorbable suture. The patient was awoken from general anesthesia and taken to the PACU in stable condition without complication.   Jillian Chapel, PA-C, present and scrubbed throughout the case, critical for completion in a timely fashion, and for retraction, instrumentation, closure.

## 2022-05-03 ENCOUNTER — Encounter (HOSPITAL_BASED_OUTPATIENT_CLINIC_OR_DEPARTMENT_OTHER): Payer: Self-pay | Admitting: Orthopaedic Surgery

## 2022-07-01 ENCOUNTER — Telehealth: Payer: Self-pay | Admitting: Neurology

## 2022-07-01 ENCOUNTER — Other Ambulatory Visit: Payer: Self-pay | Admitting: Neurology

## 2022-07-01 ENCOUNTER — Other Ambulatory Visit: Payer: Self-pay

## 2022-07-01 MED ORDER — AMLODIPINE BESYLATE 5 MG PO TABS: 5.0000 mg | ORAL_TABLET | Freq: Every day | ORAL | 0 refills | Status: DC

## 2022-07-01 NOTE — Telephone Encounter (Signed)
Pt is needing a refill on her  amLODipine (NORVASC) 5 MG tablet and she is needing it sent to the [facility] on [address]

## 2022-07-01 NOTE — Telephone Encounter (Signed)
VO Per Dr. Epimenio Foot to refill pt's Amlodipine 07/01/2022

## 2022-07-01 NOTE — Telephone Encounter (Signed)
Thank you so much. Will call pt to inform.

## 2022-07-01 NOTE — Telephone Encounter (Signed)
Escript sent in 07/01/2022

## 2022-07-01 NOTE — Telephone Encounter (Signed)
Pt last seen 03/06/22 and next f/u 08/20/22.   Phone room: please call pt back. We do not prescribe amlodipine. She will need to contact PCP for refill on this

## 2022-07-02 NOTE — Telephone Encounter (Signed)
Patient last seen on 03/06/22  Follow up scheduled on 08/20/22  Rx was approved on 07/01/22 for 30 day supply, however patient is requesting a 90 day supply.  Rx denied only 30 day approved by provider.

## 2022-08-05 ENCOUNTER — Telehealth: Payer: Self-pay | Admitting: Neurology

## 2022-08-05 NOTE — Telephone Encounter (Signed)
Pt last seen 03/06/22 by Dr. Epimenio Foot. Next f/u scheduled for 07/0/24. MS DMT: leflunomide.   I called pt. She reports L side typically the weak side over the years. Feels R side is getting a little more weak and has been gradually progressing over time. Balance worse. Having to hold onto things while walking.  Getting MRI completed 09/19/22 at [facility] imaging. Aware I will ask Tori/MRI coordinator here to see if she can send to alternate location to get her in sooner.   Confirmed she is taking leflunomide. Tolerating well, no SE. Has not missed any doses. Denies any current illness/infection.  Started on Folic acid. Added to med list. I updated med list/pharmacy/allergy list. Aware I will discuss w/ MD and call her back.

## 2022-08-05 NOTE — Telephone Encounter (Signed)
Pt feels she may be having a MS flare up, phone rep advised ED or urgent care.  Pt said it is now affecting her walking and she would like a call to discuss.

## 2022-08-05 NOTE — Telephone Encounter (Signed)
Called pt. Spoke w/ Dr. Epimenio Foot. Advised he wants to see her for visit. She accepted appt on 08/08/22 at 9am w/ Dr. Epimenio Foot. Asked she check in around 830/845am.

## 2022-08-08 ENCOUNTER — Ambulatory Visit: Payer: PPO | Admitting: Neurology

## 2022-08-08 ENCOUNTER — Encounter: Payer: Self-pay | Admitting: Neurology

## 2022-08-08 VITALS — BP 137/100 | HR 82 | Ht 65.0 in | Wt 214.0 lb

## 2022-08-08 DIAGNOSIS — G4711 Idiopathic hypersomnia with long sleep time: Secondary | ICD-10-CM | POA: Diagnosis not present

## 2022-08-08 DIAGNOSIS — F988 Other specified behavioral and emotional disorders with onset usually occurring in childhood and adolescence: Secondary | ICD-10-CM

## 2022-08-08 DIAGNOSIS — G35 Multiple sclerosis: Secondary | ICD-10-CM

## 2022-08-08 DIAGNOSIS — R5383 Other fatigue: Secondary | ICD-10-CM | POA: Diagnosis not present

## 2022-08-08 DIAGNOSIS — F32A Depression, unspecified: Secondary | ICD-10-CM

## 2022-08-08 DIAGNOSIS — R269 Unspecified abnormalities of gait and mobility: Secondary | ICD-10-CM

## 2022-08-08 MED ORDER — DALFAMPRIDINE ER 10 MG PO TB12: ORAL_TABLET | ORAL | 11 refills | Status: DC

## 2022-08-08 MED ORDER — ALPRAZOLAM 1 MG PO TABS: 1.0000 mg | ORAL_TABLET | Freq: Every evening | ORAL | 5 refills | Status: DC | PRN

## 2022-08-08 MED ORDER — ARMODAFINIL 250 MG PO TABS: 250.0000 mg | ORAL_TABLET | Freq: Every day | ORAL | 1 refills | Status: DC

## 2022-08-08 NOTE — Telephone Encounter (Signed)
MRI cervical spine order also sent to GI today.

## 2022-08-08 NOTE — Progress Notes (Signed)
[facility]  PATIENT: [name] DOB: [birth year]  REFERRING DOCTOR OR PCP:  Maurice Small SOURCE: [patient, records from [facility], MRI images on PACS  _________________________________   HISTORICAL  CHIEF COMPLAINT:  Chief Complaint  Patient presents with   Room 11    Pt is here with her Bestfriend. Pt states that things haven't been going the best. Pt states that her balance is off and her left side is weak. Pt states that her weakness is traveling to her right side. Pt states that her memory is getting worse. Pt is wanting to discuss whether or not she is relapsing or her health is declining, she is wanting more clarification. Pt states that she would like to talk about getting another MRI.      HISTORY OF PRESENT ILLNESS:  [name] Is a 47 year old woman with relapsing remitting multiple sclerosis.    She was diagnosed with MS in 2008.    Update 08/08/2022: She is on leflunomide and she tolerates it well.  Although she has no definite exacerbation she has noted progression that seems worse over the last year.   She has always been weaker on her left and has a long history of reduced balance.   She moves her left leg all the time.  It bounces even when not nervous and often at night.    She has needed to rely more on her right side but now is noting gradual worsening on that side as well.   She can only walk about a 1/4 mile without a break.   She fatigues easily while standing.  Stairs are more difficult.    She has had frequent UTI's.   She has mild hesitancy but feels she empties.   She takes an antibiotic prophylactically at times.     She is seeing urology now.    Vision is correctable to 20/20.  Colors are symmetric.   Se has more trouble with night vision.   She occasionally notes eye pain. She has RNFL thinning on OCT.     She denies any new symptoms or exacerbations.  However, she feels weaker and she has more pins/needles  dysesthesias.  She had an MRI recently.  It showed no new MS lesions.  She is having depression with some benefit from sertraline.  She felt worse on Wellbutrin.    She is currently noting anxiety > depression.    She is seeing a therapist.   She is having more difficulty with math and organizing.   Cognitively, she feels getting from point A to B is more mentally challenging.   Hard to do multi-step process.      She has fatigue that if often bad.   She also has more brain fog.  She feels both are worse with stress.   She runs out of energy most afternoons and does less because of it.     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  She does not have cataplexy or sleep paralysis though sometimes notes dreaming even before completely asleep.   Dreams can be 'insane' and often awaken her. She sometimes is not sure if she is awake or asleep.   She has sleepiness but does not fall asleep if mentally active.  She goes to bed at 9 pm.  She takes naps many day if extreme sleepiness hits.      Armodafinil was better tolerated than Adderal IR or Rtalin (felt a rush then crash).   She has  some cognitive issues and had formal neurocognitive testing in the past.  She feels she is worse than last year.    She had reduced executive functioning.   She is having more trouble figuring out how to get from point A to B in her car.   She has trouble with sensory overload at times.   Reduced verbal fluency     EPWORTH SLEEPINESS SCALE  On a scale of 0 - 3 what is the chance of dozing:  Sitting and Reading:   2 Watching TV:    2 Sitting inactive in a public place: 1 Passenger in car for one hour: 3 Lying down to rest in the afternoon: 3 Sitting and talking to someone: 0 Sitting quietly after lunch:  3 In a car, stopped in traffic:  1  Total (out of 24):    16/24  when not on armodafinil   She took high dose Vit D bit has not taken OTC supplements since.     MS History:     In 2008, she presented with left-sided  numbness and weakness. She also difficulty with her gait. An MRI of the brain was consistent with multiple sclerosis. She was seeing Dr. Vickey Huger at the time.   =Initially, she was placed on Copaxone after she was diagnosed in 2008 but stopped after a year due to MRI changes.   Then, she was placed on Betaseron but she had an exacerbation and was referred to me. We started  Tysabri in 2012. She did well on Tysabri but was high titer JCV antibody positive and stopped after a year or so.  She was started on Gilenya but stopped due to shortness of breath.  She then went on Tecfidera. She had difficulty tolerating it.    She had left greater than right arm numbness 07/05/2012 and received 3 days of IV Solu-Medrol. Of note, she had stopped Tecfidera 2 months earlier because of GI issues. MRI of the cervical spine in 2011 showed a focus adjacent to C3. An MRI performed May 2014 showed 3 enhancing lesions.   She switched to Aubagio.    An MRI of the brain 07/20/2013 at [facility] was unchanged compared to a previous one from 07/10/2012.   She had an exacerbation 12/20/2013 with right optic neuritis and received several days of IV Solu-Medrol.   Due to expensive copay, se switched to leflunomide.         Imaging: MRI of the brain 09/09/2018 showed T2/flair hyperintense foci in the hemispheres and the upper cervical spinal cord in a pattern and configuration consistent with chronic demyelinating plaque associated with multiple sclerosis.  None of the foci appears to be acute.  However, 3 small foci in the right frontal lobe and the left external capsule present on the current MRI were not present on the 2018 MRI.   There is a normal enhancement pattern and no new findings.  MRI of the cervical spine 09/09/2018 showed patchy foci within the upper cervical spine (C2, C2-C3 and C3) consistent with chronic demyelinating plaque associated with multiple sclerosis.  None of the foci appears to be acute.  There is no recent  cervical spine MRI for comparison though 2 foci were noted in the upper cervical spine on the 2018 brain MRI.  One of these was present on the 2011 cervical spine MRI.      No significant degenerative changes.  No nerve root compression or spinal stenosis.  01/18/2021  MRI of the brain 01/18/2021 showed  multiple T2/FLAIR hyperintense foci in the hemispheres and spinal cord in a pattern consistent with chronic demyelinating plaque associated with multiple sclerosis.  None of the foci enhanced or appear to be acute.  Compared to the MRI from 09/09/2018, there do not appear to be any new lesions.   REVIEW OF SYSTEMS: Constitutional: No fevers, chills, sweats, or change in appetite.   She has fatigue, daytime sleepiness and nighttime insomnia Eyes: No visual changes, double vision, eye pain Ear, nose and throat: No hearing loss, ear pain, nasal congestion, sore throat Cardiovascular: No chest pain, palpitations Respiratory:  No shortness of breath at rest or with exertion.   No wheezes GastrointestinaI: No nausea, vomiting, diarrhea, abdominal pain, fecal incontinence Genitourinary:  No dysuria, urinary retention or frequency.  No nocturia. Musculoskeletal:  No neck pain, back pain Integumentary: No rash, pruritus, skin lesions Neurological: as above Psychiatric: As above Endocrine: No palpitations, diaphoresis, change in appetite, change in weigh or increased thirst Hematologic/Lymphatic:  No anemia, purpura, petechiae. Allergic/Immunologic: No itchy/runny eyes, nasal congestion, recent allergic reactions, rashes  ALLERGIES: No Known Allergies  HOME MEDICATIONS:  Current Outpatient Medications:    amLODipine (NORVASC) 5 MG tablet, Take 1 tablet (5 mg total) by mouth daily., Disp: 60 tablet, Rfl: 0   dalfampridine 10 MG TB12, One po q12 hours, Disp: 60 tablet, Rfl: 11   folic acid (FOLVITE) 1 MG tablet, Take 1 mg by mouth daily., Disp: , Rfl:    leflunomide (ARAVA) 20 MG tablet, Take 1 tablet  (20 mg total) by mouth daily., Disp: 90 tablet, Rfl: 3   levonorgestrel (MIRENA) 20 MCG/DAY IUD, 1 each by Intrauterine route once., Disp: , Rfl:    meloxicam (MOBIC) 15 MG tablet, Take 1 tablet (15 mg total) by mouth daily. For 2 weeks for pain and inflammation. Then take as needed, Disp: 30 tablet, Rfl: 0   sertraline (ZOLOFT) 100 MG tablet, Take 1 tablet (100 mg total) by mouth daily., Disp: 90 tablet, Rfl: 3   valACYclovir (VALTREX) 500 MG tablet, Take one tablet twice daily as needed for cold sores., Disp: 30 tablet, Rfl: 0   Vitamin D, Ergocalciferol, (DRISDOL) 1.25 MG (50000 UNIT) CAPS capsule, Take 1 capsule (50,000 Units total) by mouth every 7 (seven) days., Disp: 13 capsule, Rfl: 1   ALPRAZolam (XANAX) 1 MG tablet, Take 1 tablet (1 mg total) by mouth at bedtime as needed for anxiety., Disp: 30 tablet, Rfl: 5   Armodafinil 250 MG tablet, Take 1 tablet (250 mg total) by mouth daily., Disp: 90 tablet, Rfl: 1  PAST MEDICAL HISTORY: Past Medical History:  Diagnosis Date   Anxiety    Depression    Hypertension    MS (multiple sclerosis) (HCC)    Diagnosed in 2005   MS (multiple sclerosis) (HCC)     PAST SURGICAL HISTORY: Past Surgical History:  Procedure Laterality Date   KNEE ARTHROSCOPY WITH MEDIAL MENISECTOMY Right 08/30/2020   Procedure: KNEE ARTHROSCOPY WITH MEDIAL MENISECTOMY WITH CYST DECOMPRESSION;  Surgeon: Bjorn Pippin, MD;  Location: [facility];  Service: Orthopedics;  Laterality: Right;   KNEE ARTHROSCOPY WITH MEDIAL MENISECTOMY Left 05/02/2022   Procedure: KNEE ARTHROSCOPY WITH MEDIAL MENISECTOMY;  Surgeon: Bjorn Pippin, MD;  Location: [facility];  Service: Orthopedics;  Laterality: Left;   MOUTH SURGERY     WISDOM TOOTH EXTRACTION      FAMILY HISTORY: Family History  Problem Relation Age of Onset   Depression Mother     SOCIAL  HISTORY:  Social History   Socioeconomic History   Marital status: Divorced    Spouse name: Not on  file   Number of children: Not on file   Years of education: Not on file   Highest education level: Not on file  Occupational History   Not on file  Tobacco Use   Smoking status: Former    Types: Cigarettes   Smokeless tobacco: Never  Vaping Use   Vaping Use: Never used  Substance and Sexual Activity   Alcohol use: Yes    Alcohol/week: 0.0 standard drinks of alcohol    Comment: occasional/fim   Drug use: No   Sexual activity: Not on file  Other Topics Concern   Not on file  Social History Narrative   Not on file   Social Determinants of Health   Financial Resource Strain: Not on file  Food Insecurity: Not on file  Transportation Needs: Not on file  Physical Activity: Not on file  Stress: Not on file  Social Connections: Not on file  Intimate Partner Violence: Not on file     PHYSICAL EXAM  Vitals:   08/08/22 0910  BP: (!) 137/100  Pulse: 82  Weight: 214 lb (97.1 kg)  Height: 5\' 5"  (1.651 m)    Body mass index is 35.61 kg/m.   General: The patient is well-developed and well-nourished and in no acute distress.     Neurologic Exam  Mental status: The patient is alert and oriented x 3 at the time of the examination. The patient has apparent normal recent and remote memory, with an apparently normal attention span and concentration ability.   Speech with a few errors.  Cranial nerves: Extraocular movements are full.  Color vision was symmetric.  Facial strength and sensation was normal.. No obvious hearing deficits are noted.  Motor:  Muscle bulk is normal.   Tone is normal. Strength is  5 / 5 in all 4 extremities.   Sensory: Intact sensation to touch and vibration in the limbs.  Coordination: Cerebellar testing reveals good finger-nose-finger.  Gait and station: Station is normal.   Gait is mildly wide.  The tandem gait is wide.  Romberg is negative.  Reflexes: Deep tendon reflexes are symmetric.   Leg DTRs are brisk with spread at the knees but no ankle  clonus.  .  25 foot timed walk 8.8 seconds      ASSESSMENT AND PLAN  Multiple sclerosis (HCC) - Plan: MR CERVICAL SPINE W WO CONTRAST  Gait disorder - Plan: MR CERVICAL SPINE W WO CONTRAST  Other fatigue  Gait disturbance  Hypersomnia, idiopathic  Attention deficit disorder (ADD) without hyperactivity  Depression, unspecified depression type   1.   For MS, continue leflunomide.  Check labs.  Check MRI of the brain and cervical spine to determine if there is any subclinical progression.  If this is occurring I would recommend we switch to an anti-CD20 agent (she is JCV high titer positive so cannot go on Tysabri.  Trial of dalfampridine for gait disturbance.  Renal function was fine earlier this year. 2.   Nuvigil for excessive daytime sleepiness due to narcolepsy or idiopathic hypersomnia .    Sertraline 3.  We discussed trial of protriptyline if dream issues persist and/or Wakix for daytime sleepiness. 4.    She will return to see me in 6 months if stable or call sooner if she is having new or worsening neurologic symptoms.  43 minute office visit with the majority of  the time spent face-to-face for history and physical, discussion/counseling and decision-making.  Additional time with record review and documentation.  This visit is part of a comprehensive longitudinal care medical relationship regarding the patients primary diagnosis of MS and related concerns.   Patrick Sohm A. Epimenio Foot, MD, PhD 08/08/2022, 5:09 PM Certified in Neurology, Clinical Neurophysiology, Sleep Medicine, Pain Medicine and Neuroimaging  [facility] [address] [place], [place] [address] [phone]

## 2022-08-13 ENCOUNTER — Other Ambulatory Visit: Payer: Self-pay | Admitting: *Deleted

## 2022-08-13 ENCOUNTER — Telehealth: Payer: Self-pay | Admitting: Neurology

## 2022-08-13 MED ORDER — PREDNISONE 50 MG PO TABS: ORAL_TABLET | ORAL | 0 refills | Status: DC

## 2022-08-13 NOTE — Telephone Encounter (Signed)
Pt stated she went to her eye doctor yesterday and was told she needed to contact Dr. Epimenio Foot because she needed IV steroids.

## 2022-08-13 NOTE — Addendum Note (Signed)
Addended by: Guy Begin on: 08/13/2022 10:26 AM   Modules accepted: Orders

## 2022-08-13 NOTE — Telephone Encounter (Signed)
Orders placed.

## 2022-08-13 NOTE — Telephone Encounter (Signed)
Received note from Dr. Fredrich Birks at [facility]. Referring patient for consideration of IV Steroid Therapy for retrobulbar optic neuritis OS. Bringing to POD 1 for review, patient has an appt with [place] next week

## 2022-08-13 NOTE — Telephone Encounter (Signed)
I spoke to Dr. Epimenio Foot after received note from Dr. Lorin Picket OD for pt having optic neuritis OS.  He stated if pt had new eye symptoms less then 10 days then will give 3 days IV steroids.  I called pt and she said that she had noted Tuesday pm after her appt here that she did have L eye pain, L eye blurriness, worsening over Fri-Sat - Sun. On exam saw orange blob L eye.  She has had previously and know when she has optic neuritis.  I relayed that due to recent sx that Dr. Epimenio Foot did order 3 days IV steroids.  I spoke to [place], in intrafusion. Availability tues 1400, wed 1300.  Pt will be here 1400.  Appreciated call.  3rd day will be oral steroids due to office close.  Pt verbalized understanding.

## 2022-08-13 NOTE — Telephone Encounter (Signed)
See other phone note

## 2022-08-20 ENCOUNTER — Ambulatory Visit (INDEPENDENT_AMBULATORY_CARE_PROVIDER_SITE_OTHER): Payer: PPO | Admitting: Neurology

## 2022-08-20 ENCOUNTER — Encounter: Payer: Self-pay | Admitting: Neurology

## 2022-08-20 VITALS — BP 137/93 | HR 74 | Ht 66.0 in | Wt 219.0 lb

## 2022-08-20 DIAGNOSIS — Z79899 Other long term (current) drug therapy: Secondary | ICD-10-CM | POA: Diagnosis not present

## 2022-08-20 DIAGNOSIS — H469 Unspecified optic neuritis: Secondary | ICD-10-CM

## 2022-08-20 DIAGNOSIS — G35 Multiple sclerosis: Secondary | ICD-10-CM

## 2022-08-20 NOTE — Progress Notes (Signed)
[facility]  PATIENT: [name] DOB: 12/12/1975  REFERRING DOCTOR OR PCP:  Maurice Small SOURCE: [patient, records from [facility], MRI images on PACS  _________________________________   HISTORICAL  CHIEF COMPLAINT:  Chief Complaint  Patient presents with   Room 11    Pt is here Alone. Pt states that she has blurry vision in her Left eye and she can't see color contrast. Pt states that she has been having muscle spasms in her arms. Pt states that her right eye is strained. Pt  states that her balance is still off. Pt is wanting to know where her prescription is that Dr.Johnnye Sandford prescribed the last time for her walking because she hasn't heard anything about it. Pt is wanting to talk about treatment options. Pt is wanting to talk about getting a Handicap card for her car    HISTORY OF PRESENT ILLNESS:  [name] Is a 47 year old woman with relapsing remitting multiple sclerosis.    She was diagnosed with MS in 2008.    Update 08/20/2022 She had the onset of visual change and pain on the left 08/09/2022.   Colors were also desaturated.   She saw optometry and was doagnsoded with optic neuritis.   We did  IV Solumedrol 1000 mg IV x 2 days (July 2 and 3) followed by high dose prednisone for 2 more days.     We discussed that her symptoms are consistent with an MS exacerbation with left optic neuritis.  I recommend that we switch to a stronger disease modifying therapy.  Besides this exacerbation, she has also had gait issues that might also have been due to to her further exacerbation.  Imaging studies have been scheduled but not yet performed.  She is on leflunomide and she tolerates it well.  Besides the optic neuritis currently, she also has had more difficulties with the left leg   she has always been weaker on her left and has a long history of reduced balance.   She moves her left leg all the time.  It bounces even when not nervous and often at  night.    She has needed to rely more on her right side but now is noting gradual worsening on that side as well.   She can only walk about a 1/4 mile without a break.   She fatigues easily while standing.  Stairs are more difficult.    She has had frequent UTI's.   She has mild hesitancy but feels she empties.   She takes an antibiotic prophylactically at times.     She is seeing urology now.    She has RNFL thinning on OCT.     She is having depression with some benefit from sertraline.  She felt worse on Wellbutrin.    She is currently noting anxiety > depression.    She is seeing a therapist.   She is having more difficulty with math and organizing.   Cognitively, she feels getting from point A to B is more mentally challenging.   Hard to do multi-step process.      She has fatigue that if often bad.   She also has more brain fog.  She feels both are worse with stress.   She runs out of energy most afternoons and does less because of it.     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  She does not have cataplexy or sleep paralysis though sometimes notes dreaming even before completely asleep.  Dreams can be 'insane' and often awaken her. She sometimes is not sure if she is awake or asleep.   She has sleepiness but does not fall asleep if mentally active.  She goes to bed at 9 pm.  She takes naps many day if extreme sleepiness hits.      Armodafinil was better tolerated than Adderal IR or Rtalin (felt a rush then crash).   She has some cognitive issues and had formal neurocognitive testing in the past.  She feels she is worse than last year.    She had reduced executive functioning.   She is having more trouble figuring out how to get from point A to B in her car.   She has trouble with sensory overload at times.   Reduced verbal fluency    Vision Screening   Right eye Left eye Both eyes  Without correction 20/30 20/100 20/40  With correction        EPWORTH SLEEPINESS SCALE  On a scale of 0 - 3  what is the chance of dozing:  Sitting and Reading:   2 Watching TV:    2 Sitting inactive in a public place: 1 Passenger in car for one hour: 3 Lying down to rest in the afternoon: 3 Sitting and talking to someone: 0 Sitting quietly after lunch:  3 In a car, stopped in traffic:  1  Total (out of 24):    16/24  when not on armodafinil   She took high dose Vit D bit has not taken OTC supplements since.     MS History:     In 2008, she presented with left-sided numbness and weakness. She also difficulty with her gait. An MRI of the brain was consistent with multiple sclerosis. She was seeing Dr. Vickey Huger at the time.   =Initially, she was placed on Copaxone after she was diagnosed in 2008 but stopped after a year due to MRI changes.   Then, she was placed on Betaseron but she had an exacerbation and was referred to me. We started  Tysabri in 2012. She did well on Tysabri but was high titer JCV antibody positive and stopped after a year or so.  She was started on Gilenya but stopped due to shortness of breath.  She then went on Tecfidera. She had difficulty tolerating it.    She had left greater than right arm numbness 07/05/2012 and received 3 days of IV Solu-Medrol. Of note, she had stopped Tecfidera 2 months earlier because of GI issues. MRI of the cervical spine in 2011 showed a focus adjacent to C3. An MRI performed May 2014 showed 3 enhancing lesions.   She switched to Aubagio.    An MRI of the brain 07/20/2013 at [facility] was unchanged compared to a previous one from 07/10/2012.   She had an exacerbation 12/20/2013 with right optic neuritis and received several days of IV Solu-Medrol.   Due to expensive copay, se switched to leflunomide.      She had left optic neuritis in June 2024.     Imaging: MRI of the brain 09/09/2018 showed T2/flair hyperintense foci in the hemispheres and the upper cervical spinal cord in a pattern and configuration consistent with chronic demyelinating plaque  associated with multiple sclerosis.  None of the foci appears to be acute.  However, 3 small foci in the right frontal lobe and the left external capsule present on the current MRI were not present on the 2018 MRI.   There is a  normal enhancement pattern and no new findings.  MRI of the cervical spine 09/09/2018 showed patchy foci within the upper cervical spine (C2, C2-C3 and C3) consistent with chronic demyelinating plaque associated with multiple sclerosis.  None of the foci appears to be acute.  There is no recent cervical spine MRI for comparison though 2 foci were noted in the upper cervical spine on the 2018 brain MRI.  One of these was present on the 2011 cervical spine MRI.      No significant degenerative changes.  No nerve root compression or spinal stenosis.  01/18/2021  MRI of the brain 01/18/2021 showed multiple T2/FLAIR hyperintense foci in the hemispheres and spinal cord in a pattern consistent with chronic demyelinating plaque associated with multiple sclerosis.  None of the foci enhanced or appear to be acute.  Compared to the MRI from 09/09/2018, there do not appear to be any new lesions.   REVIEW OF SYSTEMS: Constitutional: No fevers, chills, sweats, or change in appetite.   She has fatigue, daytime sleepiness and nighttime insomnia Eyes: No visual changes, double vision, eye pain Ear, nose and throat: No hearing loss, ear pain, nasal congestion, sore throat Cardiovascular: No chest pain, palpitations Respiratory:  No shortness of breath at rest or with exertion.   No wheezes GastrointestinaI: No nausea, vomiting, diarrhea, abdominal pain, fecal incontinence Genitourinary:  No dysuria, urinary retention or frequency.  No nocturia. Musculoskeletal:  No neck pain, back pain Integumentary: No rash, pruritus, skin lesions Neurological: as above Psychiatric: As above Endocrine: No palpitations, diaphoresis, change in appetite, change in weigh or increased thirst Hematologic/Lymphatic:   No anemia, purpura, petechiae. Allergic/Immunologic: No itchy/runny eyes, nasal congestion, recent allergic reactions, rashes  ALLERGIES: No Known Allergies  HOME MEDICATIONS:  Current Outpatient Medications:    ALPRAZolam (XANAX) 1 MG tablet, Take 1 tablet (1 mg total) by mouth at bedtime as needed for anxiety., Disp: 30 tablet, Rfl: 5   amLODipine (NORVASC) 5 MG tablet, Take 1 tablet (5 mg total) by mouth daily., Disp: 60 tablet, Rfl: 0   Armodafinil 250 MG tablet, Take 1 tablet (250 mg total) by mouth daily., Disp: 90 tablet, Rfl: 1   folic acid (FOLVITE) 1 MG tablet, Take 1 mg by mouth daily., Disp: , Rfl:    leflunomide (ARAVA) 20 MG tablet, Take 1 tablet (20 mg total) by mouth daily., Disp: 90 tablet, Rfl: 3   levonorgestrel (MIRENA) 20 MCG/DAY IUD, 1 each by Intrauterine route once., Disp: , Rfl:    sertraline (ZOLOFT) 100 MG tablet, Take 1 tablet (100 mg total) by mouth daily., Disp: 90 tablet, Rfl: 3   valACYclovir (VALTREX) 500 MG tablet, Take one tablet twice daily as needed for cold sores., Disp: 30 tablet, Rfl: 0   Vitamin D, Ergocalciferol, (DRISDOL) 1.25 MG (50000 UNIT) CAPS capsule, Take 1 capsule (50,000 Units total) by mouth every 7 (seven) days., Disp: 13 capsule, Rfl: 1   dalfampridine 10 MG TB12, One po q12 hours (Patient not taking: Reported on 08/20/2022), Disp: 60 tablet, Rfl: 11   meloxicam (MOBIC) 15 MG tablet, Take 1 tablet (15 mg total) by mouth daily. For 2 weeks for pain and inflammation. Then take as needed (Patient not taking: Reported on 08/20/2022), Disp: 30 tablet, Rfl: 0   predniSONE (DELTASONE) 50 MG tablet, Take 12 tablets daily (600mg ) orally for 2 days. May take all at once or split dose. (Patient not taking: Reported on 08/20/2022), Disp: 24 tablet, Rfl: 0  PAST MEDICAL HISTORY: Past Medical History:  Diagnosis Date   Anxiety    Depression    Hypertension    MS (multiple sclerosis) (HCC)    Diagnosed in 2005   MS (multiple sclerosis) (HCC)     PAST  SURGICAL HISTORY: Past Surgical History:  Procedure Laterality Date   KNEE ARTHROSCOPY WITH MEDIAL MENISECTOMY Right 08/30/2020   Procedure: KNEE ARTHROSCOPY WITH MEDIAL MENISECTOMY WITH CYST DECOMPRESSION;  Surgeon: Bjorn Pippin, MD;  Location: [facility];  Service: Orthopedics;  Laterality: Right;   KNEE ARTHROSCOPY WITH MEDIAL MENISECTOMY Left 05/02/2022   Procedure: KNEE ARTHROSCOPY WITH MEDIAL MENISECTOMY;  Surgeon: Bjorn Pippin, MD;  Location: [facility];  Service: Orthopedics;  Laterality: Left;   MOUTH SURGERY     WISDOM TOOTH EXTRACTION      FAMILY HISTORY: Family History  Problem Relation Age of Onset   Depression Mother     SOCIAL HISTORY:  Social History   Socioeconomic History   Marital status: Divorced    Spouse name: Not on file   Number of children: Not on file   Years of education: Not on file   Highest education level: Not on file  Occupational History   Not on file  Tobacco Use   Smoking status: Former    Types: Cigarettes   Smokeless tobacco: Never  Vaping Use   Vaping Use: Never used  Substance and Sexual Activity   Alcohol use: Yes    Alcohol/week: 0.0 standard drinks of alcohol    Comment: occasional/fim   Drug use: No   Sexual activity: Not on file  Other Topics Concern   Not on file  Social History Narrative   Not on file   Social Determinants of Health   Financial Resource Strain: Not on file  Food Insecurity: Not on file  Transportation Needs: Not on file  Physical Activity: Not on file  Stress: Not on file  Social Connections: Not on file  Intimate Partner Violence: Not on file     PHYSICAL EXAM  Vitals:   08/20/22 1146  BP: (!) 137/93  Pulse: 74  Weight: 219 lb (99.3 kg)  Height: 5\' 6"  (1.676 m)    Body mass index is 35.35 kg/m.  Vision Screening   Right eye Left eye Both eyes  Without correction 20/30 20/100 20/40  With correction       General: The patient is well-developed and  well-nourished and in no acute distress.   Funduscopic examination shows mild optic pallor on the left  Neurologic Exam  Mental status: The patient is alert and oriented x 3 at the time of the examination. The patient has apparent normal recent and remote memory, with an apparently normal attention span and concentration ability.   Speech with a few errors.  Cranial nerves: Extraocular movements are full.  She has a 2+ left APD color vision was reduced OS.  Facial strength and sensation was normal.. No obvious hearing deficits are noted.  Motor:  Muscle bulk is normal.   Tone is normal. Strength is  5 / 5 in all 4 extremities.   Sensory: Intact sensation to touch and vibration in the limbs.  Coordination: Cerebellar testing reveals good finger-nose-finger.  Gait and station: Station is normal.   Gait is mildly wide.  The tandem gait is wide.  Romberg is negative.  Reflexes: Deep tendon reflexes are symmetric.   Leg DTRs are brisk with spread at the knees but no ankle clonus.  Marland Kitchen  ASSESSMENT AND PLAN  Multiple sclerosis (HCC) - Plan: HIV Antibody (routine testing w rflx), Hepatitis B surface antigen, IgG, IgA, IgM, QuantiFERON-TB Gold Plus, Varicella zoster antibody, IgG, Hepatitis B core antibody, total, Hepatitis B surface antibody,qualitative, Hepatitis C antibody, Comprehensive metabolic panel, CBC with Differential/Platelet  High risk medication use - Plan: HIV Antibody (routine testing w rflx), Hepatitis B surface antigen, IgG, IgA, IgM, QuantiFERON-TB Gold Plus, Varicella zoster antibody, IgG, Hepatitis B core antibody, total, Hepatitis B surface antibody,qualitative, Hepatitis C antibody, Comprehensive metabolic panel, CBC with Differential/Platelet  Left optic neuritis   1.  She currently is experiencing an MS exacerbation with left optic neuritis.  She may have had another exacerbation in the previous year with left leg symptoms.  We discussed switching from leflunomide  to an anti CD20 agent .  Check labs.   (she is JCV high titer positive so cannot go on Tysabri).  I will do an additional 2 days of IV Solu-Medrol for the optic neuritis. 2.  She has not yet tried dalfampridine for gait disturbance.  She will pick up the prescription and give this a try to see if it helps her gait. 3.   Nuvigil for excessive daytime sleepiness due to narcolepsy or idiopathic hypersomnia .   Marland Kitchen  We discussed trial of protriptyline if dream issues persist and/or Wakix for daytime sleepiness. 4.    She will return to see me in 6 months if stable or call sooner if she is having new or worsening neurologic symptoms.   This visit is part of a comprehensive longitudinal care medical relationship regarding the patients primary diagnosis of MS and related concerns.   Margues Filippini A. Epimenio Foot, MD, PhD 08/20/2022, 12:28 PM Certified in Neurology, Clinical Neurophysiology, Sleep Medicine, Pain Medicine and Neuroimaging  [facility] [address], [address] [place], [place] [address] [phone]

## 2022-08-21 ENCOUNTER — Telehealth: Payer: Self-pay | Admitting: *Deleted

## 2022-08-21 NOTE — Telephone Encounter (Signed)
-----   Message from Asa Lente, MD sent at 08/21/2022 12:15 PM EDT ----- The lab work is fine.  We can send in the Ocrevus form.

## 2022-08-21 NOTE — Telephone Encounter (Signed)
Relayed her lab results looked fine per Dr. Epimenio Foot. Ok to start ocrevus process.  Pt verbalized understanding.

## 2022-08-21 NOTE — Telephone Encounter (Signed)
Fax confirmation received (OCREVUS) start form [phone], ph [phone].

## 2022-08-23 LAB — COMPREHENSIVE METABOLIC PANEL
ALT: 33 IU/L — ABNORMAL HIGH (ref 0–32)
AST: 11 IU/L (ref 0–40)
Albumin: 4.1 g/dL (ref 3.9–4.9)
Alkaline Phosphatase: 87 IU/L (ref 44–121)
BUN/Creatinine Ratio: 19 (ref 9–23)
BUN: 13 mg/dL (ref 6–24)
Bilirubin Total: 0.3 mg/dL (ref 0.0–1.2)
CO2: 24 mmol/L (ref 20–29)
Calcium: 9.1 mg/dL (ref 8.7–10.2)
Chloride: 101 mmol/L (ref 96–106)
Creatinine, Ser: 0.69 mg/dL (ref 0.57–1.00)
Globulin, Total: 2.6 g/dL (ref 1.5–4.5)
Glucose: 78 mg/dL (ref 70–99)
Potassium: 4 mmol/L (ref 3.5–5.2)
Sodium: 138 mmol/L (ref 134–144)
Total Protein: 6.7 g/dL (ref 6.0–8.5)
eGFR: 109 mL/min/{1.73_m2} (ref >59)

## 2022-08-23 LAB — HEPATITIS B CORE ANTIBODY, TOTAL: Hep B Core Total Ab: NEGATIVE

## 2022-08-23 LAB — IGG, IGA, IGM
IgA/Immunoglobulin A, Serum: 190 mg/dL (ref 87–352)
IgG (Immunoglobin G), Serum: 857 mg/dL (ref 586–1602)
IgM (Immunoglobulin M), Srm: 120 mg/dL (ref 26–217)

## 2022-08-23 LAB — CBC WITH DIFFERENTIAL/PLATELET
Basophils Absolute: 0 10*3/uL (ref 0.0–0.2)
Basos: 0 %
EOS (ABSOLUTE): 0.2 10*3/uL (ref 0.0–0.4)
Eos: 2 %
Hematocrit: 42 % (ref 34.0–46.6)
Hemoglobin: 14.4 g/dL (ref 11.1–15.9)
Immature Grans (Abs): 0.1 10*3/uL (ref 0.0–0.1)
Immature Granulocytes: 1 %
Lymphocytes Absolute: 2.5 10*3/uL (ref 0.7–3.1)
Lymphs: 25 %
MCH: 32.8 pg (ref 26.6–33.0)
MCHC: 34.3 g/dL (ref 31.5–35.7)
MCV: 96 fL (ref 79–97)
Monocytes Absolute: 0.7 10*3/uL (ref 0.1–0.9)
Monocytes: 7 %
Neutrophils Absolute: 6.5 10*3/uL (ref 1.4–7.0)
Neutrophils: 65 %
Platelets: 263 10*3/uL (ref 150–450)
RBC: 4.39 x10E6/uL (ref 3.77–5.28)
RDW: 12.2 % (ref 11.7–15.4)
WBC: 10 10*3/uL (ref 3.4–10.8)

## 2022-08-23 LAB — HEPATITIS B SURFACE ANTIBODY,QUALITATIVE: Hep B Surface Ab, Qual: NONREACTIVE

## 2022-08-23 LAB — HEPATITIS B SURFACE ANTIGEN: Hepatitis B Surface Ag: NEGATIVE

## 2022-08-23 LAB — QUANTIFERON-TB GOLD PLUS
QuantiFERON Mitogen Value: >10 IU/mL
QuantiFERON Nil Value: 0.01 IU/mL
QuantiFERON TB1 Ag Value: 0.04 IU/mL
QuantiFERON TB2 Ag Value: 0.03 IU/mL
QuantiFERON-TB Gold Plus: NEGATIVE

## 2022-08-23 LAB — HIV ANTIBODY (ROUTINE TESTING W REFLEX): HIV Screen 4th Generation wRfx: NONREACTIVE

## 2022-08-23 LAB — HEPATITIS C ANTIBODY: Hep C Virus Ab: NONREACTIVE

## 2022-08-23 LAB — VARICELLA ZOSTER ANTIBODY, IGG: Varicella zoster IgG: 1483 index (ref >165)

## 2022-08-26 NOTE — Telephone Encounter (Signed)
Orders completed , needing signing then will take to intrafusion.

## 2022-08-27 ENCOUNTER — Other Ambulatory Visit: Payer: Self-pay | Admitting: Neurology

## 2022-08-27 NOTE — Telephone Encounter (Signed)
Last seen on 08/20/22 No follow up scheduled

## 2022-08-29 ENCOUNTER — Telehealth: Payer: Self-pay | Admitting: Neurology

## 2022-08-29 ENCOUNTER — Other Ambulatory Visit: Payer: Self-pay

## 2022-08-29 MED ORDER — AMLODIPINE BESYLATE 5 MG PO TABS: 5.0000 mg | ORAL_TABLET | Freq: Every day | ORAL | 0 refills | Status: AC

## 2022-08-29 NOTE — Telephone Encounter (Signed)
Pt last seen 08/20/2022  Amlodipine 5mg  last filled 07/01/2022 escript to pharmacy 08/29/2022

## 2022-08-29 NOTE — Telephone Encounter (Signed)
Pt has been told that the [facility] needs a PA.  Regarding the amLODIPine Besylate 5 MG , pt states she was led to believe Dr Epimenio Foot would fill this, pt states she no longer has a PCP

## 2022-08-30 ENCOUNTER — Other Ambulatory Visit (HOSPITAL_COMMUNITY): Payer: Self-pay

## 2022-08-30 ENCOUNTER — Telehealth: Payer: Self-pay

## 2022-08-30 NOTE — Telephone Encounter (Signed)
Pharmacy Patient Advocate Encounter  Received notification from [employer]/ Rx Advance that Prior Authorization for Dalfampridine ER 10MG  er tablets has been APPROVED from 08/30/2022 to 02/26/2023.Marland Kitchen  PA #/Case ID/Reference #: PA Case ID: [id number]  $47.00 per 30DS per [facility]

## 2022-09-04 ENCOUNTER — Telehealth: Payer: Self-pay | Admitting: Neurology

## 2022-09-04 NOTE — Telephone Encounter (Signed)
RHIANNON CALLED  FROM [facility]CR[facility].  STATED PT NEEDS A REFERRAL TO FREE DRUG PROGRAM.

## 2022-09-05 NOTE — Telephone Encounter (Signed)
Per [place] in the infusion suite, patient's Ocrevus will be B/B. They will let us know if they need a referral to the Free Drug Program but at this time, they do not. If a referral is needed, the form is located here: [email or web address]://www.gene.com/download/pdf/[employer]_Patient_[email or web address]_Foundation_Form.[email or web address]

## 2022-09-09 NOTE — Telephone Encounter (Signed)
Faxed to [phone] witn confirmation received.

## 2022-09-09 NOTE — Telephone Encounter (Signed)
[employer] form completed to be signed then faxed.

## 2022-09-10 NOTE — Telephone Encounter (Signed)
   From intrafusion:  Pt estimated cost is $2469.00 pt will then meet OOP.  Pt does not qualify for copay prog , due to [employer] no funding avail.   Pt was informed and is unable to afford estimated cost.   What would you recommend next?

## 2022-09-10 NOTE — Telephone Encounter (Signed)
I relayed to intrafusion that will try to get free drug.

## 2022-09-18 ENCOUNTER — Telehealth: Payer: Self-pay | Admitting: Neurology

## 2022-09-18 NOTE — Telephone Encounter (Signed)
Call back to patient, no answer left message to call office back.

## 2022-09-18 NOTE — Telephone Encounter (Signed)
Spoke w/pt and told her provider recommendations:  Pharmacist are allowed to dispense Paxlovid without a prescription.  Additionally she can discuss with her primary care to see if  She voiced gratitude and understanding

## 2022-09-18 NOTE — Telephone Encounter (Signed)
Pt called stating that she tested positive for COVID and she is wanting to know if a medication can be called in for her to help with the symptoms of COVID and MS she is having. She also wants to discuss her Infusion and would like to know how long does she have to wait to get the infusion after she gets over COVID. Please advise.

## 2022-09-18 NOTE — Telephone Encounter (Signed)
Called pt and she stated that she tested positive for Covid on yesterday 09/17/2022. Pt states that she is wanting something to take to help her get over COVID quicker so that it doesn't interfere with her MS. Pt states that she is having tingling her fingers, lips,and face, as well as numbness. Told pt Dr. Epimenio Foot will be notified about this and we will give her a call back.

## 2022-09-19 ENCOUNTER — Ambulatory Visit
Admission: RE | Admit: 2022-09-19 | Discharge: 2022-09-19 | Disposition: A | Discharge status: Home / Self Care | Payer: PPO | Source: Ambulatory Visit | Attending: Neurology | Admitting: Neurology

## 2022-09-19 ENCOUNTER — Other Ambulatory Visit: Payer: PPO

## 2022-09-19 DIAGNOSIS — R269 Unspecified abnormalities of gait and mobility: Secondary | ICD-10-CM

## 2022-09-19 DIAGNOSIS — G35 Multiple sclerosis: Secondary | ICD-10-CM

## 2022-09-19 MED ORDER — GADOPICLENOL 0.5 MMOL/ML IV SOLN
10.0000 mL | Freq: Once | INTRAVENOUS | Status: AC | PRN
  Administered 2022-09-19: 10 mL via INTRAVENOUS

## 2022-09-30 ENCOUNTER — Telehealth: Payer: Self-pay | Admitting: Neurology

## 2022-09-30 ENCOUNTER — Encounter: Payer: Self-pay | Admitting: Neurology

## 2022-09-30 NOTE — Telephone Encounter (Signed)
Pt called and LVM wanting to know if her MRI results have come in. Please advise.

## 2022-09-30 NOTE — Telephone Encounter (Signed)
Called pt to inform. Pt wanted to know how many lesions were listed. Please call pt to advise.

## 2022-10-07 ENCOUNTER — Other Ambulatory Visit: Payer: Self-pay | Admitting: Neurology

## 2022-10-08 ENCOUNTER — Other Ambulatory Visit: Payer: Self-pay | Admitting: Neurology

## 2022-10-08 MED ORDER — ALPRAZOLAM 1 MG PO TABS: 1.0000 mg | ORAL_TABLET | Freq: Every evening | ORAL | 5 refills | Status: DC | PRN

## 2022-10-11 ENCOUNTER — Telehealth: Payer: Self-pay | Admitting: Neurology

## 2022-10-11 ENCOUNTER — Other Ambulatory Visit: Payer: Self-pay | Admitting: Neurology

## 2022-10-11 ENCOUNTER — Ambulatory Visit
Admission: RE | Admit: 2022-10-11 | Discharge: 2022-10-11 | Disposition: A | Discharge status: Home / Self Care | Payer: PPO | Source: Ambulatory Visit | Attending: Neurology | Admitting: Neurology

## 2022-10-11 DIAGNOSIS — G35 Multiple sclerosis: Secondary | ICD-10-CM

## 2022-10-11 MED ORDER — GADOPICLENOL 0.5 MMOL/ML IV SOLN
10.0000 mL | Freq: Once | INTRAVENOUS | Status: AC | PRN
  Administered 2022-10-11: 10 mL via INTRAVENOUS

## 2022-10-11 MED ORDER — PREDNISONE 50 MG PO TABS: ORAL_TABLET | ORAL | 0 refills | Status: DC

## 2022-10-11 NOTE — Telephone Encounter (Signed)
I called [name] but got voicemail so left a message.  Her MRI of the brain showed 3 enhancing lesions consistent with acute demyelination.  At the last visit we were trying to get her on Ocrevus.  I was going to see if she noted new symptoms over the last week or so.  If so, I would have her do a few days of high-dose oral steroids.

## 2022-10-11 NOTE — Telephone Encounter (Signed)
I spoke to Mr. Nest about the MRI.  It showed 3 new enhancing lesions, 1 in the left posterior inferior brainstem.  Of note last week she began to experience numbness in the left face and vertigo and hearing changes.  She was felt by urgent care to have a possible middle ear infection and was given antibiotics and a low-dose steroid for few days.  She is still symptomatic.  Therefore, I am going to have her do 4 days of high-dose oral steroids.  She just got a message from her insurance or the drug company in the last couple days that she was approved for Ocrevus.  I will send a message to our infusion service to try to get her in for infusion next week.

## 2022-10-15 ENCOUNTER — Other Ambulatory Visit: Payer: Self-pay | Admitting: Neurology

## 2022-10-15 MED ORDER — PREDNISONE 50 MG PO TABS: ORAL_TABLET | ORAL | 0 refills | Status: AC

## 2022-10-15 NOTE — Telephone Encounter (Signed)
Pt said, called the pharmacy all weekend and they have not received the prescription for predniSONE (DELTASONE) 50 MG tablet. Would rather get the infusion for the 3 days instead. Would like a call from the nurse.

## 2022-10-15 NOTE — Telephone Encounter (Signed)
Talked to pt and she stated that she wants IV Steroids today, told pt per Infusion Suite that they are booked and short staff and she won't be able to come in today. Pt states that she prefers IV Steroids and wants to know what else Dr. Epimenio Foot suggest.

## 2022-10-15 NOTE — Telephone Encounter (Signed)
Patient reported to the infusion suite that she had Covid and that when she improved, she would call the infusion suite to schedule her appointments.

## 2022-10-16 NOTE — Telephone Encounter (Signed)
Patient is coming on 10/3 and 10/17 for infusions.

## 2022-10-16 NOTE — Telephone Encounter (Signed)
I called pt as she had not read her mychart.  She did get notice from pharmacy and will get these. She will check with them about taking crushed, I thought would be ok, but referred her to check with pharmacy as well to be sure.  Take with food.  May take spread out thru day as well.  She verbalized understanding and thanked me for calling.

## 2022-10-22 NOTE — Telephone Encounter (Signed)
Patient was offered a sooner appointment with infusion but declined it.  Dr. Epimenio Foot has been made aware.

## 2022-10-22 NOTE — Telephone Encounter (Signed)
I spoke with Dr. Epimenio Foot. With patient's recent exacerbations, she should be infused ASAP and preferably sooner than October. I have let Holly in our Infusion Suite know.

## 2022-10-28 ENCOUNTER — Other Ambulatory Visit: Payer: Self-pay | Admitting: Neurology

## 2022-10-28 NOTE — Telephone Encounter (Signed)
Last seen on 08/20/22 No follow up scheduled  Rx should be prescribed by PCP

## 2022-11-28 ENCOUNTER — Telehealth: Payer: Self-pay | Admitting: Neurology

## 2022-11-28 NOTE — Telephone Encounter (Signed)
Error

## 2023-01-01 ENCOUNTER — Other Ambulatory Visit: Payer: Self-pay | Admitting: Neurology

## 2023-01-01 NOTE — Telephone Encounter (Signed)
Last seen on 08/20/22 Follow up scheduled on 02/18/23

## 2023-02-12 ENCOUNTER — Emergency Department (HOSPITAL_COMMUNITY)
Admission: EM | Admit: 2023-02-12 | Discharge: 2023-02-13 | Disposition: A | Discharge status: Home / Self Care | Payer: PPO | Attending: Emergency Medicine | Admitting: Emergency Medicine

## 2023-02-12 DIAGNOSIS — K047 Periapical abscess without sinus: Secondary | ICD-10-CM | POA: Insufficient documentation

## 2023-02-12 DIAGNOSIS — R220 Localized swelling, mass and lump, head: Secondary | ICD-10-CM | POA: Diagnosis present

## 2023-02-12 NOTE — ED Triage Notes (Signed)
 Pt reports she is having right side facial sinus pain, x 1 week and feels like she may have an infected tooth/ abscess (R. Upper). Pt went to UC today, has not picked up abx yet, pt reports that UC told her to come to ER if symptoms progress after abx. Pt with clear airway, talking in full sentences, RR even and unlabored, pt appears to be in NAD at this time. Pt informed to left front desk know if s/s worsen

## 2023-02-13 MED ORDER — AMOXICILLIN-POT CLAVULANATE 875-125 MG PO TABS
1.0000 | ORAL_TABLET | Freq: Once | ORAL | Status: AC
  Administered 2023-02-13: 1 via ORAL
  Filled 2023-02-13: qty 1

## 2023-02-13 MED ORDER — KETOROLAC TROMETHAMINE 30 MG/ML IJ SOLN
30.0000 mg | Freq: Once | INTRAMUSCULAR | Status: AC
  Administered 2023-02-13: 30 mg via INTRAMUSCULAR
  Filled 2023-02-13: qty 1

## 2023-02-13 MED ORDER — AMOXICILLIN-POT CLAVULANATE 875-125 MG PO TABS
1.0000 | ORAL_TABLET | Freq: Once | ORAL | Status: DC
  Filled 2023-02-13: qty 1

## 2023-02-13 NOTE — ED Provider Notes (Signed)
 [facility] Emergency Department Provider Note MRN:  [id number]  Arrival date & time: 02/13/23     Chief Complaint   Abscess   History of Present Illness   [name] is a 48 y.o. year-old female presents to the ED with chief complaint of facial swelling.  She was seen by her doctor and treated for dental abscess, but patient wasn't able to get the antibiotic filled because of the holiday.  She reports moderate pain and swelling that has been worsening in the right cheek.  She denies fever.    History provided by patient.   Review of Systems  Pertinent positive and negative review of systems noted in HPI.    Physical Exam   Vitals:   02/13/23 0003 02/13/23 0335  BP: (!) 171/95 (!) 161/111  Pulse: 79 81  Resp: 16 18  Temp: 98.9 F (37.2 C) 98.1 F (36.7 C)  SpO2: 100% 99%    CONSTITUTIONAL:  non toxic-appearing, NAD NEURO:  Alert and oriented x 3, CN 3-12 grossly intact EYES:  eyes equal and reactive ENT/NECK:  Supple, no stridor, no drainable dental abscess, there is tenderness and swelling into the right cheek CARDIO:  normal rate, appears well-perfused  PULM:  No respiratory distress,  GI/GU:  non-distended,  MSK/SPINE:  No gross deformities, no edema, moves all extremities  SKIN:  no rash, atraumatic   *Additional and/or pertinent findings included in MDM below  Diagnostic and Interventional Summary    EKG Interpretation Date/Time:    Ventricular Rate:    PR Interval:    QRS Duration:    QT Interval:    QTC Calculation:   R Axis:      Text Interpretation:         Labs Reviewed - No data to display  No orders to display    Medications  ketorolac  (TORADOL ) 30 MG/ML injection 30 mg (has no administration in time range)  amoxicillin -clavulanate (AUGMENTIN ) 875-125 MG per tablet 1 tablet (has no administration in time range)     Procedures  /  Critical Care Procedures  ED Course and Medical Decision Making  I have  reviewed the triage vital signs, the nursing notes, and pertinent available records from the EMR.  Social Determinants Affecting Complexity of Care: Patient has no clinically significant social determinants affecting this chief complaint..   ED Course:    Medical Decision Making Patient with dentalgia.  No abscess requiring immediate incision and drainage.  Exam not concerning for Ludwig's angina or pharyngeal abscess.  Will treat with augmentin  and toradol  IM. Pt instructed to follow-up with dentist.  Discussed return precautions. Pt safe for discharge.   Risk Prescription drug management.         Consultants: No consultations were needed in caring for this patient.   Treatment and Plan: Emergency department workup does not suggest an emergent condition requiring admission or immediate intervention beyond  what has been performed at this time. The patient is safe for discharge and has  been instructed to return immediately for worsening symptoms, change in  symptoms or any other concerns    Final Clinical Impressions(s) / ED Diagnoses     ICD-10-CM   1. Dental abscess  K04.7       ED Discharge Orders     None         Discharge Instructions Discussed with and Provided to Patient:     Discharge Instructions      Let us  know if you  have trouble getting your antibiotic.  Please follow-up with your dentist, or you can contact a dentist on the resource guide provided below.       Vicky Charleston, PA-C 02/13/23 0405    Bari Charmaine FALCON, MD 02/13/23 [phone]

## 2023-02-13 NOTE — Discharge Instructions (Addendum)
 Let us know if you have trouble getting your antibiotic.  Please follow-up with your dentist, or you can contact a dentist on the resource guide provided below.

## 2023-02-13 NOTE — ED Notes (Signed)
 Pt accidentally dropped Augmentin on the floor. Requested provider to reorder.

## 2023-02-18 ENCOUNTER — Ambulatory Visit: Payer: PPO | Admitting: Neurology

## 2023-02-18 ENCOUNTER — Encounter: Payer: Self-pay | Admitting: Neurology

## 2023-02-18 VITALS — BP 145/102 | HR 77 | Ht 65.5 in | Wt 216.5 lb

## 2023-02-18 DIAGNOSIS — H469 Unspecified optic neuritis: Secondary | ICD-10-CM

## 2023-02-18 DIAGNOSIS — G4711 Idiopathic hypersomnia with long sleep time: Secondary | ICD-10-CM

## 2023-02-18 DIAGNOSIS — R269 Unspecified abnormalities of gait and mobility: Secondary | ICD-10-CM

## 2023-02-18 DIAGNOSIS — G35 Multiple sclerosis: Secondary | ICD-10-CM | POA: Diagnosis not present

## 2023-02-18 DIAGNOSIS — R5383 Other fatigue: Secondary | ICD-10-CM

## 2023-02-18 DIAGNOSIS — F988 Other specified behavioral and emotional disorders with onset usually occurring in childhood and adolescence: Secondary | ICD-10-CM

## 2023-02-18 DIAGNOSIS — Z79899 Other long term (current) drug therapy: Secondary | ICD-10-CM | POA: Diagnosis not present

## 2023-02-18 DIAGNOSIS — F32A Depression, unspecified: Secondary | ICD-10-CM

## 2023-02-18 MED ORDER — DALFAMPRIDINE ER 10 MG PO TB12: ORAL_TABLET | ORAL | 11 refills | Status: DC

## 2023-02-18 NOTE — Progress Notes (Signed)
 [facility]  PATIENT: [name] DOB: July 31, 1975  REFERRING DOCTOR OR PCP:  Richardson Lewis SOURCE: [patient, records from [facility] Neurology, MRI images on PACS  _________________________________   HISTORICAL  CHIEF COMPLAINT:  Chief Complaint  Patient presents with   Follow-up    Pt in room 10 alone. Here for MS follow up. DMT: Ocrevus. Pt reports doing well, no concerns. Pt blood pressure elevated, takes blood pressure medication. Pt will follow up with PCP.     HISTORY OF PRESENT ILLNESS:  [name] Is a 48 year old woman with relapsing remitting multiple sclerosis.    She was diagnosed with MS in 2008.    Update 02/17/2022 She had her Ocrevus in Sept/Oct 2024.   She had mild side effects, helped by benadryl .   She has not had any new MS symptoms since starting.    She denies any exacerbations since starting the Ocrevus.     In 2024, she had at least 2 exacerbations with gait issues initially. .  She had optic neuritis on the left 08/09/2022.   Colors were also desaturated.   She saw optometry and was doagnsoded with optic neuritis.   We did  IV Solumedrol 1000 mg IV x 2 days (July 2 and 3) followed by high dose prednisone  for 2 more days.     MRI brain 10/11/2022 showed  3 new enhancing lesions, 1 in the left posterior inferior brainstem.  Of note last week she began to experience numbness in the left face and vertigo and hearing changes.  She was felt by urgent care to have a possible middle ear infection and was given antibiotics and a low-dose steroid for few days.   Her gait is doing better now compared to over the summer.  However, she still is only able to walk 1/4 to 1/2 mile due to muscle fatigability.  She does worse in the heat..   She notes more weakness and sensory changes if she walks longer distance.    Gait is better than it was over the summer.      Her vision is better but not quite to the baseline.   She has been told the [facility]NL is   thinned  She has had frequent UTI's.   She has mild hesitancy but feels she empties.   She takes an antibiotic prophylactically at times.     She is seeing urology now.    She has depression and is on Zoloft  with benefit.  Wellbutrin  had not helped.   She is currently noting anxiety > depression.    She is seeing a therapist.   She is having more difficulty with math and organizing.   Cognitively, she feels getting from point A to B is more mentally challenging.   Hard to do multi-step process.      She has fatigue that if often bad.   She also has more brain fog.  She feels both are worse with stress.   She runs out of energy most afternoons and does less because of it.     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  She does not have cataplexy or sleep paralysis though sometimes notes dreaming even before completely asleep.   Dreams can be 'insane' and often awaken her. She sometimes is not sure if she is awake or asleep.   She has sleepiness but does not fall asleep if mentally active.  She goes to bed at 9 pm.  She takes naps many day if  extreme sleepiness hits.      Armodafinil  was better tolerated than Adderal IR or Rtalin (felt a rush then crash).   She has some cognitive issues and had formal neurocognitive testing in the past.  She feels she is worse than last year.    She had reduced executive functioning.   She is having more trouble figuring out how to get from point A to B in her car.   She has trouble with sensory overload at times.   Reduced verbal fluency      EPWORTH SLEEPINESS SCALE  On a scale of 0 - 3 what is the chance of dozing:  Sitting and Reading:   2 Watching TV:    2 Sitting inactive in a public place: 1 Passenger in car for one hour: 3 Lying down to rest in the afternoon: 3 Sitting and talking to someone: 0 Sitting quietly after lunch:  3 In a car, stopped in traffic:  1  Total (out of 24):    16/24  when not on armodafinil    She took high dose Vit D bit has not  taken OTC supplements since.     MS History:     In 2008, she presented with left-sided numbness and weakness. She also difficulty with her gait. An MRI of the brain was consistent with multiple sclerosis. She was seeing Dr. Chalice at the time.   =Initially, she was placed on Copaxone after she was diagnosed in 2008 but stopped after a year due to MRI changes.   Then, she was placed on Betaseron but she had an exacerbation and was referred to me. We started  Tysabri  in 2012. She did well on Tysabri  but was high titer JCV antibody positive and stopped after a year or so.  She was started on Gilenya but stopped due to shortness of breath.  She then went on Tecfidera. She had difficulty tolerating it.    She had left greater than right arm numbness 07/05/2012 and received 3 days of IV Solu-Medrol . Of note, she had stopped Tecfidera 2 months earlier because of GI issues. MRI of the cervical spine in 2011 showed a focus adjacent to C3. An MRI performed May 2014 showed 3 enhancing lesions.   She switched to Aubagio.    An MRI of the brain 07/20/2013 at [facility] was unchanged compared to a previous one from 07/10/2012.   She had an exacerbation 12/20/2013 with right optic neuritis and received several days of IV Solu-Medrol .   Due to expensive copay, se switched to leflunomide .      She had left optic neuritis in June 2024.     Imaging: MRI of the brain 09/09/2018 showed T2/flair hyperintense foci in the hemispheres and the upper cervical spinal cord in a pattern and configuration consistent with chronic demyelinating plaque associated with multiple sclerosis.  None of the foci appears to be acute.  However, 3 small foci in the right frontal lobe and the left external capsule present on the current MRI were not present on the 2018 MRI.   There is a normal enhancement pattern and no new findings.  MRI of the cervical spine 09/09/2018 showed patchy foci within the upper cervical spine (C2, C2-C3 and C3)  consistent with chronic demyelinating plaque associated with multiple sclerosis.  None of the foci appears to be acute.  There is no recent cervical spine MRI for comparison though 2 foci were noted in the upper cervical spine on the 2018 brain MRI.  One  of these was present on the 2011 cervical spine MRI.      No significant degenerative changes.  No nerve root compression or spinal stenosis.  01/18/2021  MRI of the brain 01/18/2021 showed multiple T2/FLAIR hyperintense foci in the hemispheres and spinal cord in a pattern consistent with chronic demyelinating plaque associated with multiple sclerosis.  None of the foci enhanced or appear to be acute.  Compared to the MRI from 09/09/2018, there do not appear to be any new lesions.   REVIEW OF SYSTEMS: Constitutional: No fevers, chills, sweats, or change in appetite.   She has fatigue, daytime sleepiness and nighttime insomnia Eyes: No visual changes, double vision, eye pain Ear, nose and throat: No hearing loss, ear pain, nasal congestion, sore throat Cardiovascular: No chest pain, palpitations Respiratory:  No shortness of breath at rest or with exertion.   No wheezes GastrointestinaI: No nausea, vomiting, diarrhea, abdominal pain, fecal incontinence Genitourinary:  No dysuria, urinary retention or frequency.  No nocturia. Musculoskeletal:  No neck pain, back pain Integumentary: No rash, pruritus, skin lesions Neurological: as above Psychiatric: As above Endocrine: No palpitations, diaphoresis, change in appetite, change in weigh or increased thirst Hematologic/Lymphatic:  No anemia, purpura, petechiae. Allergic/Immunologic: No itchy/runny eyes, nasal congestion, recent allergic reactions, rashes  ALLERGIES: No Known Allergies  HOME MEDICATIONS:  Current Outpatient Medications:    ALPRAZolam  (XANAX ) 1 MG tablet, Take 1 tablet (1 mg total) by mouth at bedtime as needed for anxiety. Prescription should last 30 days, Disp: 30 tablet, Rfl: 5    amLODipine  (NORVASC ) 5 MG tablet, Take 1 tablet (5 mg total) by mouth daily., Disp: 60 tablet, Rfl: 0   amoxicillin -clavulanate (AUGMENTIN ) 875-125 MG tablet, Take 1 tablet by mouth 2 (two) times daily. 10 day Rx only, Disp: , Rfl:    Armodafinil  250 MG tablet, Take 1 tablet (250 mg total) by mouth daily., Disp: 90 tablet, Rfl: 1   folic acid (FOLVITE) 1 MG tablet, Take 1 mg by mouth daily., Disp: , Rfl:    levonorgestrel  (MIRENA ) 20 MCG/DAY IUD, 1 each by Intrauterine route once., Disp: , Rfl:    sertraline  (ZOLOFT ) 100 MG tablet, Take 1 tablet (100 mg total) by mouth daily., Disp: 90 tablet, Rfl: 3   valACYclovir  (VALTREX ) 500 MG tablet, Take one tablet twice daily as needed for cold sores., Disp: 30 tablet, Rfl: 0   Vitamin D , Ergocalciferol , (DRISDOL ) 1.25 MG (50000 UNIT) CAPS capsule, Take 1 capsule (50,000 Units total) by mouth every 7 (seven) days., Disp: 13 capsule, Rfl: 1   dalfampridine  10 MG TB12, One po q12 hours, Disp: 60 tablet, Rfl: 11   meloxicam  (MOBIC ) 15 MG tablet, Take 1 tablet (15 mg total) by mouth daily. For 2 weeks for pain and inflammation. Then take as needed (Patient not taking: Reported on 02/18/2023), Disp: 30 tablet, Rfl: 0   predniSONE  (DELTASONE ) 50 MG tablet, Take 20 tablets daily (1000mg ) orally for 4 days. May take all at once or split dose. High dose for MS exacerbation (Patient not taking: Reported on 02/18/2023), Disp: 80 tablet, Rfl: 0  PAST MEDICAL HISTORY: Past Medical History:  Diagnosis Date   Anxiety    Depression    Hypertension    MS (multiple sclerosis) (HCC)    Diagnosed in 2005   MS (multiple sclerosis) (HCC)     PAST SURGICAL HISTORY: Past Surgical History:  Procedure Laterality Date   KNEE ARTHROSCOPY WITH MEDIAL MENISECTOMY Right 08/30/2020   Procedure: KNEE ARTHROSCOPY WITH MEDIAL MENISECTOMY WITH  CYST DECOMPRESSION;  Surgeon: Cristy Bonner DASEN, MD;  Location: [facility];  Service: Orthopedics;  Laterality: Right;   KNEE  ARTHROSCOPY WITH MEDIAL MENISECTOMY Left 05/02/2022   Procedure: KNEE ARTHROSCOPY WITH MEDIAL MENISECTOMY;  Surgeon: Cristy Bonner DASEN, MD;  Location: [facility];  Service: Orthopedics;  Laterality: Left;   MOUTH SURGERY     WISDOM TOOTH EXTRACTION      FAMILY HISTORY: Family History  Problem Relation Age of Onset   Depression Mother     SOCIAL HISTORY:  Social History   Socioeconomic History   Marital status: Divorced    Spouse name: Not on file   Number of children: Not on file   Years of education: Not on file   Highest education level: Not on file  Occupational History   Not on file  Tobacco Use   Smoking status: Former    Types: Cigarettes   Smokeless tobacco: Never  Vaping Use   Vaping status: Never Used  Substance and Sexual Activity   Alcohol use: Yes    Alcohol/week: 0.0 standard drinks of alcohol    Comment: occasional/fim   Drug use: No   Sexual activity: Not on file  Other Topics Concern   Not on file  Social History Narrative   Not on file   Social Drivers of Health   Financial Resource Strain: Not on file  Food Insecurity: Not on file  Transportation Needs: Not on file  Physical Activity: Not on file  Stress: Not on file  Social Connections: Not on file  Intimate Partner Violence: Not on file     PHYSICAL EXAM  Vitals:   02/18/23 1521 02/18/23 1526 02/18/23 1529  BP: (!) 145/102 135/87 (!) 145/102  Pulse: 77  77  Weight: 216 lb 8 oz (98.2 kg)    Height: 5' 5.5 (1.664 m)      Body mass index is 35.48 kg/m.  No results found.   General: The patient is well-developed and well-nourished and in no acute distress.   Funduscopic examination shows mild optic pallor on the left  Neurologic Exam  Mental status: The patient is alert and oriented x 3 at the time of the examination. The patient has apparent normal recent and remote memory, with an apparently normal attention span and concentration ability.   Speech with a few  errors.  Cranial nerves: Extraocular movements are full.  She has a 2+ left APD color vision was reduced OS.  Facial strength and sensation was normal.. No obvious hearing deficits are noted.  Motor:  Muscle bulk is normal.   Tone is normal. Strength is  5 / 5 in all 4 extremities.   Sensory: Intact sensation to touch and vibration in the limbs.  Coordination: Cerebellar testing reveals good finger-nose-finger.  Gait and station: Station is normal.   Gait is mildly wide.  The tandem gait is wide.  Romberg is negative.  Reflexes: Deep tendon reflexes are symmetric.   Leg DTRs are brisk with spread at the knees but no ankle clonus.  .  25 foot timed walk was 8.6 sec (average of 2)      ASSESSMENT AND PLAN  Multiple sclerosis (HCC)  High risk medication use  Left optic neuritis  Gait disorder  Other fatigue  Hypersomnia, idiopathic  Attention deficit disorder (ADD) without hyperactivity  Depression, unspecified depression type   1.  She is stable since starting Ocrevus.  She will continue 600 mg every 6 months.  Around the  time of the next visit I will check IgG/IgM.   2.  She has not yet tried dalfampridine  for gait disturbance due to a high co-pay.  The [facility] and I will [facility] in a prescription there..   3.   Continue Nuvigil  for excessive daytime sleepiness due to narcolepsy or idiopathic hypersomnia .   Trial of protriptyline if dream issues persist and/or Wakix for daytime sleepiness. 4.    She will return to see me in 6 months if stable or call sooner if she is having new or worsening neurologic symptoms.   This visit is part of a comprehensive longitudinal care medical relationship regarding the patients primary diagnosis of MS and related concerns.   Jillian Martha A. Vear, MD, PhD 02/18/2023, 8:28 PM Certified in Neurology, Clinical Neurophysiology, Sleep Medicine, Pain Medicine and Neuroimaging  [facility] [address], [address] [place], [place] [address] [phone]

## 2023-03-11 ENCOUNTER — Other Ambulatory Visit: Payer: Self-pay | Admitting: Neurology

## 2023-04-08 ENCOUNTER — Other Ambulatory Visit: Payer: Self-pay | Admitting: Neurology

## 2023-04-09 ENCOUNTER — Encounter (INDEPENDENT_AMBULATORY_CARE_PROVIDER_SITE_OTHER): Payer: Self-pay | Admitting: Neurology

## 2023-04-09 DIAGNOSIS — G35 Multiple sclerosis: Secondary | ICD-10-CM | POA: Diagnosis not present

## 2023-04-13 ENCOUNTER — Other Ambulatory Visit: Payer: Self-pay | Admitting: Neurology

## 2023-04-14 MED ORDER — GABAPENTIN 300 MG PO CAPS: 900.0000 mg | ORAL_CAPSULE | Freq: Every day | ORAL | 3 refills | Status: AC

## 2023-04-14 NOTE — Addendum Note (Signed)
 Addended by: Levert Feinstein on: 04/14/2023 09:15 AM   Modules accepted: Orders

## 2023-04-14 NOTE — Telephone Encounter (Signed)
 Chart reviewed, was seen by Dr. Epimenio Foot in January 2025 for MS, narcolepsy, on ocrelizumab, armodafinil, also suffered depression anxiety, gait abnormality,  Now complaints of restless leg symptoms  Will try gabapentin 300mg  up to 3 tablets every night  Meds ordered this encounter  Medications   gabapentin (NEURONTIN) 300 MG capsule    Sig: Take 3 capsules (900 mg total) by mouth at bedtime.    Dispense:  90 capsule    Refill:  3     Please see the MyChart message reply(ies) for my assessment and plan.    This patient gave consent for this Medical Advice Message and is aware that it may result in a bill to [employer], as well as the possibility of receiving a bill for a co-payment or deductible. They are an established patient, but are not seeking medical advice exclusively about a problem treated during an in person or video visit in the last seven days. I did not recommend an in person or video visit within seven days of my reply.    I spent a total of 10 minutes cumulative time within 7 days through [employer].  Levert Feinstein, MD

## 2023-04-14 NOTE — Telephone Encounter (Signed)
 Last seen 02/18/23 Next appt 09/18/23  Dispenses   Dispensed Days Supply Quantity Provider Pharmacy  ALPRAZOLAM 1MG  TABLETS 02/19/2023 30 30 each Sater, Pearletha Furl, MD [facility] #...  ALPRAZOLAM 1MG  TABLETS 12/04/2022 30 30 each Sater, Pearletha Furl, MD [facility] #...  ALPRAZOLAM 1MG  TABLETS 10/08/2022 30 30 each Sater, Pearletha Furl, MD [facility] DR[facility] STORE #...  ALPRAZOLAM 1MG  TABLETS 08/30/2022 30 30 each Sater, Pearletha Furl, MD [facility] DR[facility] STORE #...  ALPRAZOLAM 1MG  TABLETS 07/01/2022 30 30 each Sater, Pearletha Furl, MD [facility] DR[facility] STORE #.Marland KitchenMarland Kitchen

## 2023-04-29 ENCOUNTER — Encounter: Payer: Self-pay | Admitting: Neurology

## 2023-04-30 ENCOUNTER — Other Ambulatory Visit: Payer: Self-pay | Admitting: Neurology

## 2023-04-30 MED ORDER — ROPINIROLE HCL 0.5 MG PO TABS: ORAL_TABLET | ORAL | 2 refills | Status: AC

## 2023-06-26 DIAGNOSIS — G35 Multiple sclerosis: Secondary | ICD-10-CM | POA: Diagnosis not present

## 2023-07-09 ENCOUNTER — Encounter: Payer: Self-pay | Admitting: Neurology

## 2023-07-09 ENCOUNTER — Other Ambulatory Visit: Payer: Self-pay | Admitting: *Deleted

## 2023-07-09 MED ORDER — ALPRAZOLAM 1 MG PO TABS: 1.0000 mg | ORAL_TABLET | Freq: Every evening | ORAL | 2 refills | Status: DC | PRN

## 2023-07-09 NOTE — Telephone Encounter (Signed)
   Last seen 02/18/23 and next f/u 09/18/23. Last refilled 04/14/23 #30.  Dr. Godwin Lat- are you ok with refilling? No mention in last note if you were going to rx/refill for pt.

## 2023-07-18 DIAGNOSIS — F411 Generalized anxiety disorder: Secondary | ICD-10-CM | POA: Diagnosis not present

## 2023-07-18 DIAGNOSIS — F331 Major depressive disorder, recurrent, moderate: Secondary | ICD-10-CM | POA: Diagnosis not present

## 2023-07-23 ENCOUNTER — Encounter: Payer: Self-pay | Admitting: *Deleted

## 2023-07-30 NOTE — Telephone Encounter (Signed)
 Pt has not read mychart. I called pt at [phone]. LVM for pt to call office.

## 2023-07-31 ENCOUNTER — Telehealth: Payer: Self-pay | Admitting: Neurology

## 2023-07-31 NOTE — Telephone Encounter (Signed)
 Noted

## 2023-07-31 NOTE — Telephone Encounter (Signed)
 Pt returned phone called , Informed Pt that jessica left forms up front  for Pt to pick up . Pt is currently out of town but agreed to pick up forms either Monday or Tuesday within office hours

## 2023-08-05 NOTE — Telephone Encounter (Signed)
 Patient came by the office on 08/05/23 and signed [employer] pt consent form, it was faxed to [phone]. Confirmation received.

## 2023-08-07 ENCOUNTER — Encounter: Payer: Self-pay | Admitting: Neurology

## 2023-08-07 DIAGNOSIS — R202 Paresthesia of skin: Secondary | ICD-10-CM | POA: Diagnosis not present

## 2023-08-07 DIAGNOSIS — Z1231 Encounter for screening mammogram for malignant neoplasm of breast: Secondary | ICD-10-CM | POA: Diagnosis not present

## 2023-08-07 DIAGNOSIS — Q278 Other specified congenital malformations of peripheral vascular system: Secondary | ICD-10-CM | POA: Diagnosis not present

## 2023-08-08 ENCOUNTER — Other Ambulatory Visit: Payer: Self-pay | Admitting: Family Medicine

## 2023-08-08 ENCOUNTER — Other Ambulatory Visit: Payer: Self-pay | Admitting: Neurology

## 2023-08-08 DIAGNOSIS — G35 Multiple sclerosis: Secondary | ICD-10-CM

## 2023-08-08 DIAGNOSIS — R202 Paresthesia of skin: Secondary | ICD-10-CM

## 2023-08-08 DIAGNOSIS — Z79899 Other long term (current) drug therapy: Secondary | ICD-10-CM

## 2023-08-08 MED ORDER — SERTRALINE HCL 100 MG PO TABS: 150.0000 mg | ORAL_TABLET | Freq: Every day | ORAL | 3 refills | Status: AC

## 2023-08-26 ENCOUNTER — Ambulatory Visit
Admission: RE | Admit: 2023-08-26 | Discharge: 2023-08-26 | Disposition: A | Discharge status: Home / Self Care | Source: Ambulatory Visit | Attending: Family Medicine | Admitting: Family Medicine

## 2023-08-26 ENCOUNTER — Other Ambulatory Visit: Payer: Self-pay | Admitting: Family Medicine

## 2023-08-26 DIAGNOSIS — R202 Paresthesia of skin: Secondary | ICD-10-CM

## 2023-08-27 ENCOUNTER — Ambulatory Visit
Admission: RE | Admit: 2023-08-27 | Discharge: 2023-08-27 | Disposition: A | Discharge status: Home / Self Care | Source: Ambulatory Visit | Attending: Family Medicine | Admitting: Family Medicine

## 2023-08-27 DIAGNOSIS — R202 Paresthesia of skin: Secondary | ICD-10-CM

## 2023-09-11 DIAGNOSIS — F331 Major depressive disorder, recurrent, moderate: Secondary | ICD-10-CM | POA: Diagnosis not present

## 2023-09-11 DIAGNOSIS — F411 Generalized anxiety disorder: Secondary | ICD-10-CM | POA: Diagnosis not present

## 2023-09-18 ENCOUNTER — Encounter: Payer: Self-pay | Admitting: Neurology

## 2023-09-18 ENCOUNTER — Ambulatory Visit: Payer: PPO | Admitting: Neurology

## 2023-09-18 VITALS — BP 135/93 | HR 93 | Ht 65.0 in | Wt 216.0 lb

## 2023-09-18 DIAGNOSIS — H469 Unspecified optic neuritis: Secondary | ICD-10-CM

## 2023-09-18 DIAGNOSIS — G35 Multiple sclerosis: Secondary | ICD-10-CM

## 2023-09-18 DIAGNOSIS — R269 Unspecified abnormalities of gait and mobility: Secondary | ICD-10-CM

## 2023-09-18 DIAGNOSIS — G4711 Idiopathic hypersomnia with long sleep time: Secondary | ICD-10-CM

## 2023-09-18 DIAGNOSIS — R5383 Other fatigue: Secondary | ICD-10-CM

## 2023-09-18 DIAGNOSIS — F988 Other specified behavioral and emotional disorders with onset usually occurring in childhood and adolescence: Secondary | ICD-10-CM | POA: Diagnosis not present

## 2023-09-18 DIAGNOSIS — Z79899 Other long term (current) drug therapy: Secondary | ICD-10-CM | POA: Diagnosis not present

## 2023-09-18 MED ORDER — DALFAMPRIDINE ER 10 MG PO TB12: ORAL_TABLET | ORAL | 11 refills | Status: AC

## 2023-09-18 MED ORDER — ARMODAFINIL 250 MG PO TABS: 250.0000 mg | ORAL_TABLET | Freq: Every day | ORAL | 5 refills | Status: DC

## 2023-09-18 NOTE — Progress Notes (Signed)
 [facility]  PATIENT: [name] DOB: 1975-08-19  REFERRING DOCTOR OR PCP:  Richardson Lewis SOURCE: [patient, records from [facility], MRI images on PACS  _________________________________   HISTORICAL  CHIEF COMPLAINT:  Chief Complaint  Patient presents with   Follow-up    Pt in room 10. Alone. Here for MS follow up. DMT: Ocrevus Last infusion date: 06/26/2023 Next infusion date: 12/23/2023. Pt reports doing okay. Pt said left arm has burning sensation , had ultrasound of left arm and told blood flow is normal. Left side goes numb at times.     HISTORY OF PRESENT ILLNESS:  [name] Is a 48 year old woman with relapsing remitting multiple sclerosis.    She was diagnosed with MS in 2008.    Update 09/18/2022 She had her first Ocrevus in Sept/Oct 2024.  Next infusion is November 2025.  We discussed getting labs about 2-3 weeks before the infusion.    She had mild side effects, helped by benadryl .  We discussed that if this worsens we will consider Briumvi she has not had any new MS symptoms since starting.    She denies any exacerbations since starting the Ocrevus.     In 2024, she had at least 2 exacerbations with gait issues initially.  Additionally, she had optic neuritis on the left 08/09/2022.  She had been reluctant to switch to a more efficacious disease modifying therapy due to infectious disease risk.  However, with these exacerbations and changes on MRI she opted to change therapy to Ocrevus.SABRA  MRI brain 10/11/2022 showed  3 new enhancing lesions, 1 in the left posterior inferior brainstem.  Of note last week she began to experience numbness in the left face and vertigo and hearing changes.  She was felt by urgent care to have a possible middle ear infection and was given antibiotics and a low-dose steroid for few days.   Her gait is better than last year but if tired or hot she has a right foot drop. She cannot walk as far as she was   able to do a couple years ago.  She does not always keep up.   She notes more weakness and sensory changes if she walks longer distance.      She has dysesthesias in the left arm (forearm mostly) but not into the hand - is triggered by holding hone or sometimes awakens her at night.  No weakness.    Her vision is better but not quite to the baseline.   She has been told the [facility]NL is  thinned  She has mild hesitancy but feels she empties.  No recent UTI.     Depression is better with the higher dose of Zoloft .  Wellbutrin  had not helped.   She is currently noting anxiety > depression.    She is seeing a therapist.   She is having more difficulty with math and organizing.   Cognitively, she feels getting from point A to B is more mentally challenging.   Hard to do multi-step process.      She has fatigue that if often bad.   Cognitively, she noes brain fog.    She feels both are worse with stress.   She runs out of energy most afternoons and does less because of it.     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  She does not have cataplexy or sleep paralysis though sometimes notes dreaming even before completely asleep.   Dreams can be intense and often  awaken her. She sometimes is not sure if she is awake or asleep.   She has sleepiness but does not fall asleep if mentally active.  She goes to bed at 9 pm.  She takes naps many day if extreme sleepiness hits.      Armodafinil  was better tolerated than Adderal IR or Rtalin (felt a rush then crash). She has some cognitive issues and had formal neurocognitive testing in the past.  She feels she is worse than last year.    She had reduced executive functioning.    She has trouble with sensory overload at times.   Reduced verbal fluency      EPWORTH SLEEPINESS SCALE  On a scale of 0 - 3 what is the chance of dozing:  Sitting and Reading:   2 Watching TV:    2 Sitting inactive in a public place: 1 Passenger in car for one hour: 3 Lying down to rest in the  afternoon: 3 Sitting and talking to someone: 0 Sitting quietly after lunch:  3 In a car, stopped in traffic:  1  Total (out of 24):    16/24  when not on armodafinil    She took high dose Vit D bit has not taken OTC supplements since.     MS History:     In 2008, she presented with left-sided numbness and weakness. She also difficulty with her gait. An MRI of the brain was consistent with multiple sclerosis. She was seeing Dr. Chalice at the time.   =Initially, she was placed on Copaxone after she was diagnosed in 2008 but stopped after a year due to MRI changes.   Then, she was placed on Betaseron but she had an exacerbation and was referred to me. We started  Tysabri  in 2012. She did well on Tysabri  but was high titer JCV antibody positive and stopped after a year or so.  She was started on Gilenya but stopped due to shortness of breath.  She then went on Tecfidera. She had difficulty tolerating it.    She had left greater than right arm numbness 07/05/2012 and received 3 days of IV Solu-Medrol . Of note, she had stopped Tecfidera 2 months earlier because of GI issues. MRI of the cervical spine in 2011 showed a focus adjacent to C3. An MRI performed May 2014 showed 3 enhancing lesions.   She switched to Aubagio.    An MRI of the brain 07/20/2013 at [facility] was unchanged compared to a previous one from 07/10/2012.   She had an exacerbation 12/20/2013 with right optic neuritis and received several days of IV Solu-Medrol .   Due to expensive copay, se switched to leflunomide .      She had left optic neuritis in June 2024.     Imaging: MRI of the brain 09/09/2018 showed T2/flair hyperintense foci in the hemispheres and the upper cervical spinal cord in a pattern and configuration consistent with chronic demyelinating plaque associated with multiple sclerosis.  None of the foci appears to be acute.  However, 3 small foci in the right frontal lobe and the left external capsule present on the current MRI  were not present on the 2018 MRI.   There is a normal enhancement pattern and no new findings.  MRI of the cervical spine 09/09/2018 showed patchy foci within the upper cervical spine (C2, C2-C3 and C3) consistent with chronic demyelinating plaque associated with multiple sclerosis.  None of the foci appears to be acute.  There is no recent cervical spine  MRI for comparison though 2 foci were noted in the upper cervical spine on the 2018 brain MRI.  One of these was present on the 2011 cervical spine MRI.      No significant degenerative changes.  No nerve root compression or spinal stenosis.  01/18/2021  MRI of the brain 01/18/2021 showed multiple T2/FLAIR hyperintense foci in the hemispheres and spinal cord in a pattern consistent with chronic demyelinating plaque associated with multiple sclerosis.  None of the foci enhanced or appear to be acute.  Compared to the MRI from 09/09/2018, there do not appear to be any new lesions.   REVIEW OF SYSTEMS: Constitutional: No fevers, chills, sweats, or change in appetite.   She has fatigue, daytime sleepiness and nighttime insomnia Eyes: No visual changes, double vision, eye pain Ear, nose and throat: No hearing loss, ear pain, nasal congestion, sore throat Cardiovascular: No chest pain, palpitations Respiratory:  No shortness of breath at rest or with exertion.   No wheezes GastrointestinaI: No nausea, vomiting, diarrhea, abdominal pain, fecal incontinence Genitourinary:  No dysuria, urinary retention or frequency.  No nocturia. Musculoskeletal:  No neck pain, back pain Integumentary: No rash, pruritus, skin lesions Neurological: as above Psychiatric: As above Endocrine: No palpitations, diaphoresis, change in appetite, change in weigh or increased thirst Hematologic/Lymphatic:  No anemia, purpura, petechiae. Allergic/Immunologic: No itchy/runny eyes, nasal congestion, recent allergic reactions, rashes  ALLERGIES: No Known Allergies  HOME  MEDICATIONS:  Current Outpatient Medications:    ALPRAZolam  (XANAX ) 1 MG tablet, Take 1 tablet (1 mg total) by mouth at bedtime as needed for anxiety. PRESCRIPTION SHOULD LAST 30 DAYS, Disp: 30 tablet, Rfl: 2   amLODipine  (NORVASC ) 5 MG tablet, Take 1 tablet (5 mg total) by mouth daily., Disp: 60 tablet, Rfl: 0   folic acid (FOLVITE) 1 MG tablet, Take 1 mg by mouth daily., Disp: , Rfl:    levonorgestrel  (MIRENA ) 20 MCG/DAY IUD, 1 each by Intrauterine route once., Disp: , Rfl:    rOPINIRole  (REQUIP ) 0.5 MG tablet, 1 or 2 p.o. as needed for restless leg syndrome, up to 4 a day, Disp: 60 tablet, Rfl: 2   sertraline  (ZOLOFT ) 100 MG tablet, Take 1.5 tablets (150 mg total) by mouth daily., Disp: 145 tablet, Rfl: 3   valACYclovir  (VALTREX ) 500 MG tablet, Take one tablet twice daily as needed for cold sores., Disp: 30 tablet, Rfl: 0   Vitamin D , Ergocalciferol , (DRISDOL ) 1.25 MG (50000 UNIT) CAPS capsule, TAKE 1 CAPSULE BY MOUTH EVERY 7 DAYS, Disp: 13 capsule, Rfl: 1   amoxicillin -clavulanate (AUGMENTIN ) 875-125 MG tablet, Take 1 tablet by mouth 2 (two) times daily. 10 day Rx only (Patient not taking: Reported on 09/18/2023), Disp: , Rfl:    Armodafinil  250 MG tablet, Take 1 tablet (250 mg total) by mouth daily., Disp: 30 tablet, Rfl: 5   dalfampridine  10 MG TB12, One po q12 hours, Disp: 60 tablet, Rfl: 11   gabapentin  (NEURONTIN ) 300 MG capsule, Take 3 capsules (900 mg total) by mouth at bedtime. (Patient not taking: Reported on 09/18/2023), Disp: 90 capsule, Rfl: 3   meloxicam  (MOBIC ) 15 MG tablet, Take 1 tablet (15 mg total) by mouth daily. For 2 weeks for pain and inflammation. Then take as needed (Patient not taking: Reported on 09/18/2023), Disp: 30 tablet, Rfl: 0   predniSONE  (DELTASONE ) 50 MG tablet, Take 20 tablets daily (1000mg ) orally for 4 days. May take all at once or split dose. High dose for MS exacerbation (Patient not taking: Reported on 09/18/2023),  Disp: 80 tablet, Rfl: 0  PAST MEDICAL  HISTORY: Past Medical History:  Diagnosis Date   Anxiety    Depression    Hypertension    MS (multiple sclerosis) (HCC)    Diagnosed in 2005   MS (multiple sclerosis) (HCC)     PAST SURGICAL HISTORY: Past Surgical History:  Procedure Laterality Date   KNEE ARTHROSCOPY WITH MEDIAL MENISECTOMY Right 08/30/2020   Procedure: KNEE ARTHROSCOPY WITH MEDIAL MENISECTOMY WITH CYST DECOMPRESSION;  Surgeon: Cristy Bonner DASEN, MD;  Location: [facility];  Service: Orthopedics;  Laterality: Right;   KNEE ARTHROSCOPY WITH MEDIAL MENISECTOMY Left 05/02/2022   Procedure: KNEE ARTHROSCOPY WITH MEDIAL MENISECTOMY;  Surgeon: Cristy Bonner DASEN, MD;  Location:  SURGERY CENTER;  Service: Orthopedics;  Laterality: Left;   MOUTH SURGERY     WISDOM TOOTH EXTRACTION      FAMILY HISTORY: Family History  Problem Relation Age of Onset   Depression Mother     SOCIAL HISTORY:  Social History   Socioeconomic History   Marital status: Divorced    Spouse name: Not on file   Number of children: Not on file   Years of education: Not on file   Highest education level: Not on file  Occupational History   Not on file  Tobacco Use   Smoking status: Former    Types: Cigarettes   Smokeless tobacco: Never  Vaping Use   Vaping status: Never Used  Substance and Sexual Activity   Alcohol use: Yes    Alcohol/week: 0.0 standard drinks of alcohol    Comment: occasional/fim   Drug use: No   Sexual activity: Not on file  Other Topics Concern   Not on file  Social History Narrative   Not on file   Social Drivers of Health   Financial Resource Strain: Not on file  Food Insecurity: Not on file  Transportation Needs: Not on file  Physical Activity: Not on file  Stress: Not on file  Social Connections: Not on file  Intimate Partner Violence: Not on file     PHYSICAL EXAM  Vitals:   09/18/23 1527  BP: (!) 135/93  Pulse: 93  Weight: 216 lb (98 kg)  Height: 5' 5 (1.651 m)    Body  mass index is 35.94 kg/m.  No results found.   General: The patient is well-developed and well-nourished and in no acute distress.   Funduscopic examination shows mild optic pallor on the left  Neurologic Exam  Mental status: The patient is alert and oriented x 3 at the time of the examination. The patient has apparent normal recent and remote memory, with an apparently normal attention span and concentration ability.   Speech with a few errors.  Cranial nerves: Extraocular movements are full.  She has a 2+ left APD color vision was reduced OS.  Facial strength and sensation was normal.. No obvious hearing deficits are noted.  Motor:  Muscle bulk is normal.   Tone is normal. Strength is  5 / 5 in all 4 extremities.   Sensory: Intact sensation to touch and vibration in the limbs.  Coordination: Cerebellar testing reveals good finger-nose-finger.  Gait and station: Station is normal.   Gait is mildly wide.  The tandem gait is wide.  The Romberg is negative. Reflexes: Deep tendon reflexes are symmetric.   Leg DTRs are brisk with spread at the knees but no ankle clonus.  .  25 foot timed walk was 8.6 sec (average of 2)  ASSESSMENT AND PLAN  Multiple sclerosis (HCC)  High risk medication use  Left optic neuritis  Gait disorder  Other fatigue  Hypersomnia, idiopathic  Attention deficit disorder (ADD) without hyperactivity   1.  She is stable since starting Ocrevus.  She will continue 600 mg every 6 months.   A couple weeks before her next infusion we will check CBC with differential, IgG/IgM and CD20.  We also discussed that if the throat pain with infusions continues we will consider a switch to either Kesimpta or Briumvi 2.  She has not yet tried dalfampridine  for gait disturbance due to a high co-pay.  The [facility] less [facility] and I will [facility] in a prescription there..   3.   Continue Nuvigil  for excessive daytime sleepiness due to  narcolepsy or idiopathic hypersomnia .  4.    She will return to see me in 6 months if stable or call sooner if she is having new or worsening neurologic symptoms.   This visit is part of a comprehensive longitudinal care medical relationship regarding the patients primary diagnosis of MS and related concerns.   Daleisa Halperin A. Vear, MD, PhD 09/18/2023, 8:41 PM Certified in Neurology, Clinical Neurophysiology, Sleep Medicine, Pain Medicine and Neuroimaging  [facility] [address], [address] [place], [place] [address] [phone]

## 2023-09-29 DIAGNOSIS — H00019 Hordeolum externum unspecified eye, unspecified eyelid: Secondary | ICD-10-CM | POA: Diagnosis not present

## 2023-09-30 DIAGNOSIS — M9902 Segmental and somatic dysfunction of thoracic region: Secondary | ICD-10-CM | POA: Diagnosis not present

## 2023-09-30 DIAGNOSIS — M5136 Other intervertebral disc degeneration, lumbar region with discogenic back pain only: Secondary | ICD-10-CM | POA: Diagnosis not present

## 2023-09-30 DIAGNOSIS — M5134 Other intervertebral disc degeneration, thoracic region: Secondary | ICD-10-CM | POA: Diagnosis not present

## 2023-09-30 DIAGNOSIS — M5032 Other cervical disc degeneration, mid-cervical region, unspecified level: Secondary | ICD-10-CM | POA: Diagnosis not present

## 2023-09-30 DIAGNOSIS — M9903 Segmental and somatic dysfunction of lumbar region: Secondary | ICD-10-CM | POA: Diagnosis not present

## 2023-09-30 DIAGNOSIS — M9901 Segmental and somatic dysfunction of cervical region: Secondary | ICD-10-CM | POA: Diagnosis not present

## 2023-10-02 DIAGNOSIS — H0011 Chalazion right upper eyelid: Secondary | ICD-10-CM | POA: Diagnosis not present

## 2023-10-07 DIAGNOSIS — M5136 Other intervertebral disc degeneration, lumbar region with discogenic back pain only: Secondary | ICD-10-CM | POA: Diagnosis not present

## 2023-10-07 DIAGNOSIS — M5032 Other cervical disc degeneration, mid-cervical region, unspecified level: Secondary | ICD-10-CM | POA: Diagnosis not present

## 2023-10-07 DIAGNOSIS — M9901 Segmental and somatic dysfunction of cervical region: Secondary | ICD-10-CM | POA: Diagnosis not present

## 2023-10-07 DIAGNOSIS — M5134 Other intervertebral disc degeneration, thoracic region: Secondary | ICD-10-CM | POA: Diagnosis not present

## 2023-10-07 DIAGNOSIS — M9902 Segmental and somatic dysfunction of thoracic region: Secondary | ICD-10-CM | POA: Diagnosis not present

## 2023-10-07 DIAGNOSIS — M9903 Segmental and somatic dysfunction of lumbar region: Secondary | ICD-10-CM | POA: Diagnosis not present

## 2023-10-08 ENCOUNTER — Encounter: Payer: Self-pay | Admitting: *Deleted

## 2023-10-14 DIAGNOSIS — M5136 Other intervertebral disc degeneration, lumbar region with discogenic back pain only: Secondary | ICD-10-CM | POA: Diagnosis not present

## 2023-10-14 DIAGNOSIS — M5032 Other cervical disc degeneration, mid-cervical region, unspecified level: Secondary | ICD-10-CM | POA: Diagnosis not present

## 2023-10-14 DIAGNOSIS — M9903 Segmental and somatic dysfunction of lumbar region: Secondary | ICD-10-CM | POA: Diagnosis not present

## 2023-10-14 DIAGNOSIS — M5134 Other intervertebral disc degeneration, thoracic region: Secondary | ICD-10-CM | POA: Diagnosis not present

## 2023-10-14 DIAGNOSIS — M9901 Segmental and somatic dysfunction of cervical region: Secondary | ICD-10-CM | POA: Diagnosis not present

## 2023-10-14 DIAGNOSIS — M9902 Segmental and somatic dysfunction of thoracic region: Secondary | ICD-10-CM | POA: Diagnosis not present

## 2023-10-15 DIAGNOSIS — M5032 Other cervical disc degeneration, mid-cervical region, unspecified level: Secondary | ICD-10-CM | POA: Diagnosis not present

## 2023-10-15 DIAGNOSIS — M9903 Segmental and somatic dysfunction of lumbar region: Secondary | ICD-10-CM | POA: Diagnosis not present

## 2023-10-15 DIAGNOSIS — M5136 Other intervertebral disc degeneration, lumbar region with discogenic back pain only: Secondary | ICD-10-CM | POA: Diagnosis not present

## 2023-10-15 DIAGNOSIS — M9902 Segmental and somatic dysfunction of thoracic region: Secondary | ICD-10-CM | POA: Diagnosis not present

## 2023-10-15 DIAGNOSIS — M5134 Other intervertebral disc degeneration, thoracic region: Secondary | ICD-10-CM | POA: Diagnosis not present

## 2023-10-15 DIAGNOSIS — M9901 Segmental and somatic dysfunction of cervical region: Secondary | ICD-10-CM | POA: Diagnosis not present

## 2023-10-20 DIAGNOSIS — M9901 Segmental and somatic dysfunction of cervical region: Secondary | ICD-10-CM | POA: Diagnosis not present

## 2023-10-20 DIAGNOSIS — M5136 Other intervertebral disc degeneration, lumbar region with discogenic back pain only: Secondary | ICD-10-CM | POA: Diagnosis not present

## 2023-10-20 DIAGNOSIS — M5032 Other cervical disc degeneration, mid-cervical region, unspecified level: Secondary | ICD-10-CM | POA: Diagnosis not present

## 2023-10-20 DIAGNOSIS — M9902 Segmental and somatic dysfunction of thoracic region: Secondary | ICD-10-CM | POA: Diagnosis not present

## 2023-10-20 DIAGNOSIS — M5134 Other intervertebral disc degeneration, thoracic region: Secondary | ICD-10-CM | POA: Diagnosis not present

## 2023-10-20 DIAGNOSIS — M9903 Segmental and somatic dysfunction of lumbar region: Secondary | ICD-10-CM | POA: Diagnosis not present

## 2023-11-03 ENCOUNTER — Other Ambulatory Visit: Payer: Self-pay | Admitting: Neurology

## 2023-11-03 NOTE — Telephone Encounter (Signed)
 Last seen on 09/18/23 Follow up scheduled 04/29/24  Last vitamin d  level checked on 1/24 per result  The lab work showed that your vitamin D  was low.  The rest of the labs were fine.  I am going to send in a prescription strength vitamin D  supplement to your pharmacy.  Take 1 a week for the next 6 months.  When you run out, start taking 5000 units a day (available from any drugstore or most foods stores)   Rx denied

## 2023-11-10 DIAGNOSIS — M9902 Segmental and somatic dysfunction of thoracic region: Secondary | ICD-10-CM | POA: Diagnosis not present

## 2023-11-10 DIAGNOSIS — M5032 Other cervical disc degeneration, mid-cervical region, unspecified level: Secondary | ICD-10-CM | POA: Diagnosis not present

## 2023-11-10 DIAGNOSIS — M5136 Other intervertebral disc degeneration, lumbar region with discogenic back pain only: Secondary | ICD-10-CM | POA: Diagnosis not present

## 2023-11-10 DIAGNOSIS — M5134 Other intervertebral disc degeneration, thoracic region: Secondary | ICD-10-CM | POA: Diagnosis not present

## 2023-11-10 DIAGNOSIS — M9901 Segmental and somatic dysfunction of cervical region: Secondary | ICD-10-CM | POA: Diagnosis not present

## 2023-11-10 DIAGNOSIS — M9903 Segmental and somatic dysfunction of lumbar region: Secondary | ICD-10-CM | POA: Diagnosis not present

## 2023-11-12 ENCOUNTER — Encounter: Payer: Self-pay | Admitting: Neurology

## 2023-11-12 DIAGNOSIS — M5136 Other intervertebral disc degeneration, lumbar region with discogenic back pain only: Secondary | ICD-10-CM | POA: Diagnosis not present

## 2023-11-12 DIAGNOSIS — M5134 Other intervertebral disc degeneration, thoracic region: Secondary | ICD-10-CM | POA: Diagnosis not present

## 2023-11-12 DIAGNOSIS — M9901 Segmental and somatic dysfunction of cervical region: Secondary | ICD-10-CM | POA: Diagnosis not present

## 2023-11-12 DIAGNOSIS — M5032 Other cervical disc degeneration, mid-cervical region, unspecified level: Secondary | ICD-10-CM | POA: Diagnosis not present

## 2023-11-12 DIAGNOSIS — M9902 Segmental and somatic dysfunction of thoracic region: Secondary | ICD-10-CM | POA: Diagnosis not present

## 2023-11-12 DIAGNOSIS — M9903 Segmental and somatic dysfunction of lumbar region: Secondary | ICD-10-CM | POA: Diagnosis not present

## 2023-11-13 ENCOUNTER — Other Ambulatory Visit: Payer: Self-pay

## 2023-11-13 ENCOUNTER — Other Ambulatory Visit (INDEPENDENT_AMBULATORY_CARE_PROVIDER_SITE_OTHER): Payer: Self-pay

## 2023-11-13 DIAGNOSIS — G35D Multiple sclerosis, unspecified: Secondary | ICD-10-CM

## 2023-11-13 DIAGNOSIS — Z0289 Encounter for other administrative examinations: Secondary | ICD-10-CM

## 2023-11-14 ENCOUNTER — Ambulatory Visit: Payer: Self-pay | Admitting: Neurology

## 2023-11-14 LAB — CBC WITH DIFFERENTIAL/PLATELET
Basophils Absolute: 0 x10E3/uL (ref 0.0–0.2)
Basos: 1 %
EOS (ABSOLUTE): 0.2 x10E3/uL (ref 0.0–0.4)
Eos: 2 %
Hematocrit: 42.7 % (ref 34.0–46.6)
Hemoglobin: 14.4 g/dL (ref 11.1–15.9)
Immature Grans (Abs): 0 x10E3/uL (ref 0.0–0.1)
Immature Granulocytes: 0 %
Lymphocytes Absolute: 1.8 x10E3/uL (ref 0.7–3.1)
Lymphs: 28 %
MCH: 32.1 pg (ref 26.6–33.0)
MCHC: 33.7 g/dL (ref 31.5–35.7)
MCV: 95 fL (ref 79–97)
Monocytes Absolute: 0.7 x10E3/uL (ref 0.1–0.9)
Monocytes: 11 %
Neutrophils Absolute: 3.7 x10E3/uL (ref 1.4–7.0)
Neutrophils: 58 %
Platelets: 261 x10E3/uL (ref 150–450)
RBC: 4.48 x10E6/uL (ref 3.77–5.28)
RDW: 12.2 % (ref 11.7–15.4)
WBC: 6.4 x10E3/uL (ref 3.4–10.8)

## 2023-11-14 LAB — IGG, IGA, IGM
IgA/Immunoglobulin A, Serum: 223 mg/dL (ref 87–352)
IgG (Immunoglobin G), Serum: 781 mg/dL (ref 586–1602)
IgM (Immunoglobulin M), Srm: 95 mg/dL (ref 26–217)

## 2023-11-18 ENCOUNTER — Other Ambulatory Visit: Payer: Self-pay

## 2023-11-18 DIAGNOSIS — Z79899 Other long term (current) drug therapy: Secondary | ICD-10-CM

## 2023-11-18 DIAGNOSIS — G35D Multiple sclerosis, unspecified: Secondary | ICD-10-CM | POA: Diagnosis not present

## 2023-11-18 DIAGNOSIS — Z0289 Encounter for other administrative examinations: Secondary | ICD-10-CM

## 2023-11-20 LAB — CD20 B CELLS
% CD19-B Cells: 0 % — ABNORMAL LOW (ref 4.6–22.1)
% CD20-B Cells: 0 % — ABNORMAL LOW (ref 5.0–22.3)

## 2023-11-21 ENCOUNTER — Ambulatory Visit: Payer: Self-pay | Admitting: Neurology

## 2023-11-26 DIAGNOSIS — M5134 Other intervertebral disc degeneration, thoracic region: Secondary | ICD-10-CM | POA: Diagnosis not present

## 2023-11-26 DIAGNOSIS — M9903 Segmental and somatic dysfunction of lumbar region: Secondary | ICD-10-CM | POA: Diagnosis not present

## 2023-11-26 DIAGNOSIS — M9902 Segmental and somatic dysfunction of thoracic region: Secondary | ICD-10-CM | POA: Diagnosis not present

## 2023-11-26 DIAGNOSIS — M5136 Other intervertebral disc degeneration, lumbar region with discogenic back pain only: Secondary | ICD-10-CM | POA: Diagnosis not present

## 2023-11-26 DIAGNOSIS — M9901 Segmental and somatic dysfunction of cervical region: Secondary | ICD-10-CM | POA: Diagnosis not present

## 2023-11-26 DIAGNOSIS — M5032 Other cervical disc degeneration, mid-cervical region, unspecified level: Secondary | ICD-10-CM | POA: Diagnosis not present

## 2023-12-08 ENCOUNTER — Telehealth: Payer: Self-pay | Admitting: *Deleted

## 2023-12-08 NOTE — Telephone Encounter (Signed)
 Faxed completed form to [employer] copay relief, received fax confirmation.

## 2023-12-10 DIAGNOSIS — M5136 Other intervertebral disc degeneration, lumbar region with discogenic back pain only: Secondary | ICD-10-CM | POA: Diagnosis not present

## 2023-12-10 DIAGNOSIS — M9903 Segmental and somatic dysfunction of lumbar region: Secondary | ICD-10-CM | POA: Diagnosis not present

## 2023-12-10 DIAGNOSIS — M9901 Segmental and somatic dysfunction of cervical region: Secondary | ICD-10-CM | POA: Diagnosis not present

## 2023-12-10 DIAGNOSIS — M5032 Other cervical disc degeneration, mid-cervical region, unspecified level: Secondary | ICD-10-CM | POA: Diagnosis not present

## 2023-12-10 DIAGNOSIS — M5134 Other intervertebral disc degeneration, thoracic region: Secondary | ICD-10-CM | POA: Diagnosis not present

## 2023-12-10 DIAGNOSIS — M9902 Segmental and somatic dysfunction of thoracic region: Secondary | ICD-10-CM | POA: Diagnosis not present

## 2023-12-15 DIAGNOSIS — M5136 Other intervertebral disc degeneration, lumbar region with discogenic back pain only: Secondary | ICD-10-CM | POA: Diagnosis not present

## 2023-12-15 DIAGNOSIS — M9901 Segmental and somatic dysfunction of cervical region: Secondary | ICD-10-CM | POA: Diagnosis not present

## 2023-12-15 DIAGNOSIS — M5032 Other cervical disc degeneration, mid-cervical region, unspecified level: Secondary | ICD-10-CM | POA: Diagnosis not present

## 2023-12-15 DIAGNOSIS — M5134 Other intervertebral disc degeneration, thoracic region: Secondary | ICD-10-CM | POA: Diagnosis not present

## 2023-12-15 DIAGNOSIS — M9902 Segmental and somatic dysfunction of thoracic region: Secondary | ICD-10-CM | POA: Diagnosis not present

## 2023-12-15 DIAGNOSIS — M9903 Segmental and somatic dysfunction of lumbar region: Secondary | ICD-10-CM | POA: Diagnosis not present

## 2023-12-17 DIAGNOSIS — H40053 Ocular hypertension, bilateral: Secondary | ICD-10-CM | POA: Diagnosis not present

## 2023-12-23 ENCOUNTER — Encounter: Payer: Self-pay | Admitting: Neurology

## 2023-12-24 ENCOUNTER — Other Ambulatory Visit: Payer: Self-pay | Admitting: Neurology

## 2023-12-24 DIAGNOSIS — M9902 Segmental and somatic dysfunction of thoracic region: Secondary | ICD-10-CM | POA: Diagnosis not present

## 2023-12-24 DIAGNOSIS — M5136 Other intervertebral disc degeneration, lumbar region with discogenic back pain only: Secondary | ICD-10-CM | POA: Diagnosis not present

## 2023-12-24 DIAGNOSIS — M9901 Segmental and somatic dysfunction of cervical region: Secondary | ICD-10-CM | POA: Diagnosis not present

## 2023-12-24 DIAGNOSIS — M5032 Other cervical disc degeneration, mid-cervical region, unspecified level: Secondary | ICD-10-CM | POA: Diagnosis not present

## 2023-12-24 DIAGNOSIS — M5134 Other intervertebral disc degeneration, thoracic region: Secondary | ICD-10-CM | POA: Diagnosis not present

## 2023-12-24 DIAGNOSIS — M9903 Segmental and somatic dysfunction of lumbar region: Secondary | ICD-10-CM | POA: Diagnosis not present

## 2023-12-24 MED ORDER — ALPRAZOLAM 1 MG PO TABS: 1.0000 mg | ORAL_TABLET | Freq: Every evening | ORAL | 3 refills | Status: AC | PRN

## 2023-12-25 DIAGNOSIS — G35A Relapsing-remitting multiple sclerosis: Secondary | ICD-10-CM | POA: Diagnosis not present

## 2024-01-07 DIAGNOSIS — M5136 Other intervertebral disc degeneration, lumbar region with discogenic back pain only: Secondary | ICD-10-CM | POA: Diagnosis not present

## 2024-01-07 DIAGNOSIS — F411 Generalized anxiety disorder: Secondary | ICD-10-CM | POA: Diagnosis not present

## 2024-01-07 DIAGNOSIS — M5134 Other intervertebral disc degeneration, thoracic region: Secondary | ICD-10-CM | POA: Diagnosis not present

## 2024-01-07 DIAGNOSIS — M9901 Segmental and somatic dysfunction of cervical region: Secondary | ICD-10-CM | POA: Diagnosis not present

## 2024-01-07 DIAGNOSIS — M9902 Segmental and somatic dysfunction of thoracic region: Secondary | ICD-10-CM | POA: Diagnosis not present

## 2024-01-07 DIAGNOSIS — M5032 Other cervical disc degeneration, mid-cervical region, unspecified level: Secondary | ICD-10-CM | POA: Diagnosis not present

## 2024-01-07 DIAGNOSIS — F331 Major depressive disorder, recurrent, moderate: Secondary | ICD-10-CM | POA: Diagnosis not present

## 2024-01-07 DIAGNOSIS — M9903 Segmental and somatic dysfunction of lumbar region: Secondary | ICD-10-CM | POA: Diagnosis not present

## 2024-01-14 DIAGNOSIS — M9901 Segmental and somatic dysfunction of cervical region: Secondary | ICD-10-CM | POA: Diagnosis not present

## 2024-01-14 DIAGNOSIS — M9902 Segmental and somatic dysfunction of thoracic region: Secondary | ICD-10-CM | POA: Diagnosis not present

## 2024-01-14 DIAGNOSIS — M5032 Other cervical disc degeneration, mid-cervical region, unspecified level: Secondary | ICD-10-CM | POA: Diagnosis not present

## 2024-01-14 DIAGNOSIS — M5134 Other intervertebral disc degeneration, thoracic region: Secondary | ICD-10-CM | POA: Diagnosis not present

## 2024-01-14 DIAGNOSIS — M5136 Other intervertebral disc degeneration, lumbar region with discogenic back pain only: Secondary | ICD-10-CM | POA: Diagnosis not present

## 2024-01-14 DIAGNOSIS — M9903 Segmental and somatic dysfunction of lumbar region: Secondary | ICD-10-CM | POA: Diagnosis not present

## 2024-01-22 ENCOUNTER — Encounter: Payer: Self-pay | Admitting: Neurology

## 2024-01-23 ENCOUNTER — Other Ambulatory Visit: Payer: Self-pay | Admitting: Neurology

## 2024-01-23 MED ORDER — OSELTAMIVIR PHOSPHATE 75 MG PO CAPS: 75.0000 mg | ORAL_CAPSULE | Freq: Two times a day (BID) | ORAL | 0 refills | Status: AC

## 2024-02-25 ENCOUNTER — Encounter: Payer: Self-pay | Admitting: Neurology

## 2024-02-25 MED ORDER — ARMODAFINIL 250 MG PO TABS: 250.0000 mg | ORAL_TABLET | Freq: Every day | ORAL | 5 refills | Status: AC

## 2024-02-25 NOTE — Telephone Encounter (Signed)
 Last seen 09/18/23 Next appt 04/29/24  Dispenses  No dispenses within 180 days of the adherence period (since 03/01/2023)   How do dispenses affect the score?

## 2024-04-29 ENCOUNTER — Ambulatory Visit: Admitting: Neurology
